# Patient Record
Sex: Female | Born: 1970 | Race: Black or African American | Hispanic: No | Marital: Married | Smoking: Never smoker
Health system: Southern US, Community
[De-identification: ages and names within clinical notes are randomized; demographics above are authoritative.]

## PROBLEM LIST (undated history)

## (undated) DIAGNOSIS — I10 Essential (primary) hypertension: Secondary | ICD-10-CM

## (undated) DIAGNOSIS — G629 Polyneuropathy, unspecified: Secondary | ICD-10-CM

## (undated) HISTORY — PX: CHOLECYSTECTOMY: SHX55

## (undated) HISTORY — PX: ABDOMINAL HYSTERECTOMY: SHX81

## (undated) HISTORY — DX: Polyneuropathy, unspecified: G62.9

---

## 2009-07-01 ENCOUNTER — Emergency Department: Payer: Self-pay | Admitting: Emergency Medicine

## 2010-03-25 HISTORY — PX: CHOLECYSTECTOMY: SHX55

## 2011-02-21 ENCOUNTER — Ambulatory Visit: Payer: Self-pay | Admitting: Family Medicine

## 2011-08-28 ENCOUNTER — Ambulatory Visit: Payer: Self-pay | Admitting: Obstetrics and Gynecology

## 2011-08-28 LAB — BASIC METABOLIC PANEL
Anion Gap: 8 (ref 7–16)
BUN: 13 mg/dL (ref 7–18)
Calcium, Total: 8.9 mg/dL (ref 8.5–10.1)
Chloride: 107 mmol/L (ref 98–107)
Co2: 29 mmol/L (ref 21–32)
Creatinine: 0.69 mg/dL (ref 0.60–1.30)
EGFR (African American): >60
EGFR (Non-African Amer.): >60
Glucose: 91 mg/dL (ref 65–99)
Osmolality: 287 (ref 275–301)
Potassium: 4.5 mmol/L (ref 3.5–5.1)

## 2011-08-28 LAB — CBC
MCH: 25.1 pg — ABNORMAL LOW (ref 26.0–34.0)
MCV: 79 fL — ABNORMAL LOW (ref 80–100)
RBC: 5.34 10*6/uL — ABNORMAL HIGH (ref 3.80–5.20)
RDW: 19.3 % — ABNORMAL HIGH (ref 11.5–14.5)
WBC: 6.1 10*3/uL (ref 3.6–11.0)

## 2011-09-02 ENCOUNTER — Inpatient Hospital Stay: Payer: Self-pay | Admitting: Obstetrics and Gynecology

## 2011-09-02 HISTORY — PX: TOTAL ABDOMINAL HYSTERECTOMY: SHX209

## 2011-09-10 LAB — PATHOLOGY REPORT

## 2011-10-30 ENCOUNTER — Ambulatory Visit: Payer: Self-pay | Admitting: Obstetrics and Gynecology

## 2014-04-12 ENCOUNTER — Ambulatory Visit: Payer: Self-pay | Admitting: Family Medicine

## 2014-07-17 NOTE — H&P (Signed)
PATIENT NAME:  Joyce Rose, Joyce Rose MR#:  [id number] DATE OF BIRTH:  1970-08-01  DATE OF ADMISSION:  09/02/2011  PREOPERATIVE DIAGNOSIS: Multifibroid uterus.   HISTORY: Joyce Rose is a 44 year old married African American female para 0-0-2-0, now status post six months of Depo-Lupron injections for treatment of large central pelvic mass, who presents for definitive surgery. Patient had a 36 week sized fibroid uterus prior to onset of Lupron therapy. The mass has decreased in size to approximately 29 weeks size. Patient is amenorrheic. She is having mild hot flashes.   PAST MEDICAL HISTORY:  1. Increased body mass index.  2. Dysmenorrhea.   PAST SURGICAL HISTORY:  1. Open cholecystectomy.  2. TAB.  PAST OB HISTORY: Para 0-0-2-0. SAB x1. TAB x1.   PAST GYN HISTORY: Menarche age 44. History of dysmenorrhea. No history of abnormal Pap smears. No history of STI or pelvic inflammatory disease.   FAMILY HISTORY: Negative for cancer of the colon, ovary, or breast. There is heart disease in father who had CABG x4. There was no diabetes mellitus in the family.   SOCIAL HISTORY: Patient does not smoke, does not drink, does not use drugs.   REVIEW OF SYSTEMS: Patient denies recent illness. She denies history of coagulopathy. She denies reactive airway disease.   PHYSICAL EXAMINATION:  VITAL SIGNS: Height 5 feet 2, weight 196.3, blood pressure 150/92, heart rate 66.   GENERAL: The patient is a pleasant, well-appearing African American female in no acute distress. She is alert and oriented.   OROPHARYNX: Clear.   NECK: Supple. There is no thyromegaly or adenopathy.   LUNGS: Clear.   HEART: Regular rate and rhythm without murmur, S3 or S4.   BACK: Without CVA tenderness.   ABDOMEN: Soft. There is a mildly tender central pelvic mass more prominent on the left than the right extending to the subxiphoid region. The fundal height is 29 cm. No organomegaly of the liver or spleen can be appreciated.    PELVIC: External genitalia normal. BUS normal. Vagina has good estrogen effect. The cervix is suspended high in the vagina. It is poorly mobile. Adnexa cannot be assessed due to the size of the pelvic mass. The mass is mobile.   RECTOVAGINAL: Deferred.   EXTREMITIES: Without clubbing, cyanosis, or edema.   SKIN: Without rash.   MUSCULOSKELETAL: Exam is normal.   NEUROLOGIC: Normal.   IMPRESSION: Multifibroid uterus, status post six months of Depo-Lupron therapy.   PLAN: Exploratory laparotomy through a midline incision with total abdominal hysterectomy. Date of surgery is 09/02/2011.    CONSENT NOTE: Joyce Rose is to undergo total abdominal hysterectomy through a midline incision. She is understanding of the planned procedures and is aware of and is accepting of all surgical risks which include but are not limited to bleeding, infection, pelvic organ injury with need for repair, blood clot disorders, anesthesia risks, and death. Patient understands that we will not remove the ovaries unless pathology is identified. She is understanding of this and is accepting of it and she is ready and willing to proceed with surgery as scheduled.   ____________________________ Prentice DockerMartin A. Salil Raineri, MD mad:cms D: 09/01/2011 17:37:28 ET T: 09/02/2011 07:25:00 ET JOB#: [id number]  cc: Daphine DeutscherMartin A. Karoline Fleer, MD, <Dictator>  Prentice DockerMARTIN A Malike Foglio MD ELECTRONICALLY SIGNED 09/03/2011 13:16

## 2014-07-17 NOTE — Op Note (Signed)
PATIENT NAME:  Joyce Rose, Joyce Rose MR#:  [id number] DATE OF BIRTH:  01-24-1971  DATE OF PROCEDURE:  09/02/2011  PREOPERATIVE DIAGNOSIS: 30 week multifibroid uterus.   POSTOPERATIVE DIAGNOSES: 1. 30 week multifibroid uterus.  2. Pelvic adhesions.   OPERATIVE PROCEDURES: Exploratory laparotomy with total abdominal hysterectomy and adhesiolysis.   SURGEON: Prentice Docker. Joyce Collister, MD  FIRST ASSISTANT: None.   ANESTHESIA: General endotracheal.   INDICATIONS: Joyce Rose is a 44 year old African American female para 0-0-2-0, status post six months of Depo-Lupron therapy with reduction in size of uterine fibroids from 36 weeks to 30 weeks, presents for laparotomy for management of central pelvic mass.   FINDINGS AT SURGERY: Findings at surgery revealed a multifibroid nodular uterus measuring approximately 30 weeks size. The right adnexa was adhesed to the pelvic sidewall and subsequent adhesiolysis had to be performed in order to restore normal anatomy. The left ovary and tube were normal. Multiple adhesions between the omentum and bowel were lysed during the surgical procedure.   DESCRIPTION OF PROCEDURE: Patient was brought to the Operating Room where she was placed in supine position. General endotracheal anesthesia was induced without difficulty. A ChloraPrep abdominal and perineal prep and drape was performed in the standard fashion. A Foley catheter was placed and was draining clear yellow urine from the bladder. A midline incision was made 1 cm below the umbilicus to the suprapubic region 1 fingerbreadth above the symphysis pubis. The fascia was incised in the midline and this was dissected off of the rectus muscles to identify the midline raphe. The peritoneum was then entered. Exploration of the abdomen reveals the central pelvic mass which was nodular and somewhat mobile. There were adhesions identified posteriorly. Eventually the 30 week size mass was elevated and delivered through the  abdominal incision. Richardson retractors were used to facilitate exposure and the hysterectomy was then performed in a modified manner. The left utero-ovarian ligament was doubly clamped, cut, and stick tied using 0 Vicryl. Adjacent adhesions between the omentum and bowel were lysed using Metzenbaum scissors. The round ligament was then doubly clamped and cut with subsequent suture ligation being performed with 0 Vicryl. Posteriorly as the uterus was elevated more out of the pelvis further adhesiolysis had to be performed. Several of the thickened adhesions were taken down with hemostats followed by free ties. Eventually the pelvis was completely elevated out of the pelvis following the adhesiolysis. On the right side the utero-ovarian ligament was doubly clamped, cut, and stick tied using 0 Vicryl. Again similarly the round ligament and the fallopian tube were crossclamped using Heaney clamps followed by cutting and stick tying. This was done sequentially on both sides to the point of where the uterine arteries were triply clamped and cut. These again were stick tied using 0 Vicryl. The anterior leaf of the peritoneum was opened and the bladder flap was created through sharp dissection. Further clamping, cutting, and stick tying using straight clamps and 0 Vicryl suture were used. Once we got to the level of the uterosacral ligaments the mass was excised with a scalpel. The remaining cervical stump was grasped with a double-tooth tenaculum. Further adhesiolysis was performed on the right adnexa to mobilize the adhesed right ovary. The ureters were noted to be in normal anatomic position bilaterally and had peristalsis. Next, the cervical stump was then sequentially isolated, clamped, cut, and stick tied with 0 Vicryl suture. This was done as the bladder flap was reflected further off of the cervix. Once we got to the angles of the  cervix curved Heaney clamps were used to crossclamp the structure followed by  excision with Jorgenson scissors. The angles of the vagina were ligated using Richardson stitch of 0 Vicryl. The intervening vaginal mucosa was reapproximated using simple figure-of-eight sutures of 0 Vicryl. Good hemostasis was obtained. Once this was accomplished the pelvis was copiously irrigated. The irrigant fluid was aspirated. Good hemostasis was noted. Closure of the wound was then accomplished with removal of Balfour retractor. The laps were collected and sponge counts were verified as correct. The incision was closed with 0 Maxon in simple running manner. The subcuticular and subcutaneous tissues were reapproximated using 2-0 Vicryl. The skin was closed with staples. A pressure dressing was applied. Patient was then awakened, mobilized, and taken to recovery room in satisfactory condition. Estimated blood loss was 400 mL. IV fluids were 1700 mL. Urine output was 450 mL.   ____________________________ Prentice DockerMartin A. Enos Muhl, MD mad:cms D: 09/03/2011 13:06:00 ET T: 09/03/2011 13:28:36 ET  JOB#: [id number] cc: Daphine DeutscherMartin A. Rajesh Wyss, MD, <Dictator> Prentice DockerMARTIN A Joyce Kimoto MD ELECTRONICALLY SIGNED 09/10/2011 12:49

## 2015-03-30 ENCOUNTER — Other Ambulatory Visit: Payer: Self-pay | Admitting: Family Medicine

## 2015-03-30 DIAGNOSIS — Z1231 Encounter for screening mammogram for malignant neoplasm of breast: Secondary | ICD-10-CM

## 2015-04-14 ENCOUNTER — Ambulatory Visit
Admission: RE | Admit: 2015-04-14 | Discharge: 2015-04-14 | Disposition: A | Discharge status: Home / Self Care | Payer: BLUE CROSS/BLUE SHIELD | Source: Ambulatory Visit | Attending: Family Medicine | Admitting: Family Medicine

## 2015-04-14 DIAGNOSIS — Z1231 Encounter for screening mammogram for malignant neoplasm of breast: Secondary | ICD-10-CM | POA: Diagnosis present

## 2016-05-02 ENCOUNTER — Other Ambulatory Visit: Payer: Self-pay | Admitting: Family Medicine

## 2016-05-02 DIAGNOSIS — Z1231 Encounter for screening mammogram for malignant neoplasm of breast: Secondary | ICD-10-CM

## 2016-05-30 ENCOUNTER — Ambulatory Visit: Payer: BLUE CROSS/BLUE SHIELD

## 2016-06-24 ENCOUNTER — Ambulatory Visit
Admission: RE | Admit: 2016-06-24 | Discharge: 2016-06-24 | Disposition: A | Discharge status: Home / Self Care | Payer: BLUE CROSS/BLUE SHIELD | Source: Ambulatory Visit | Attending: Family Medicine | Admitting: Family Medicine

## 2016-06-24 DIAGNOSIS — Z1231 Encounter for screening mammogram for malignant neoplasm of breast: Secondary | ICD-10-CM | POA: Insufficient documentation

## 2018-12-25 IMAGING — MG MM DIGITAL SCREENING BILAT W/ CAD
5 series · 5 of 5 positions shown · non-contrast
Comparison: Previous exam(s).

CLINICAL DATA: Screening.

EXAM:
DIGITAL SCREENING BILATERAL MAMMOGRAM WITH CAD

[R XCCL]
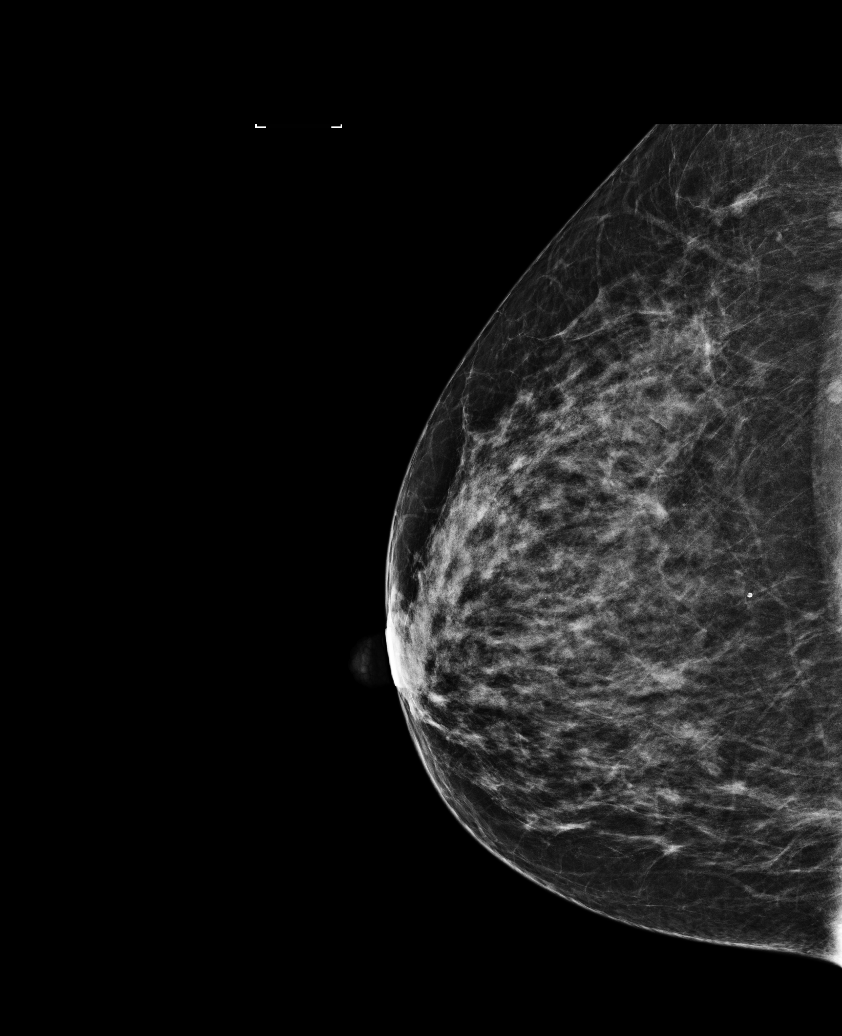

[L MLO]
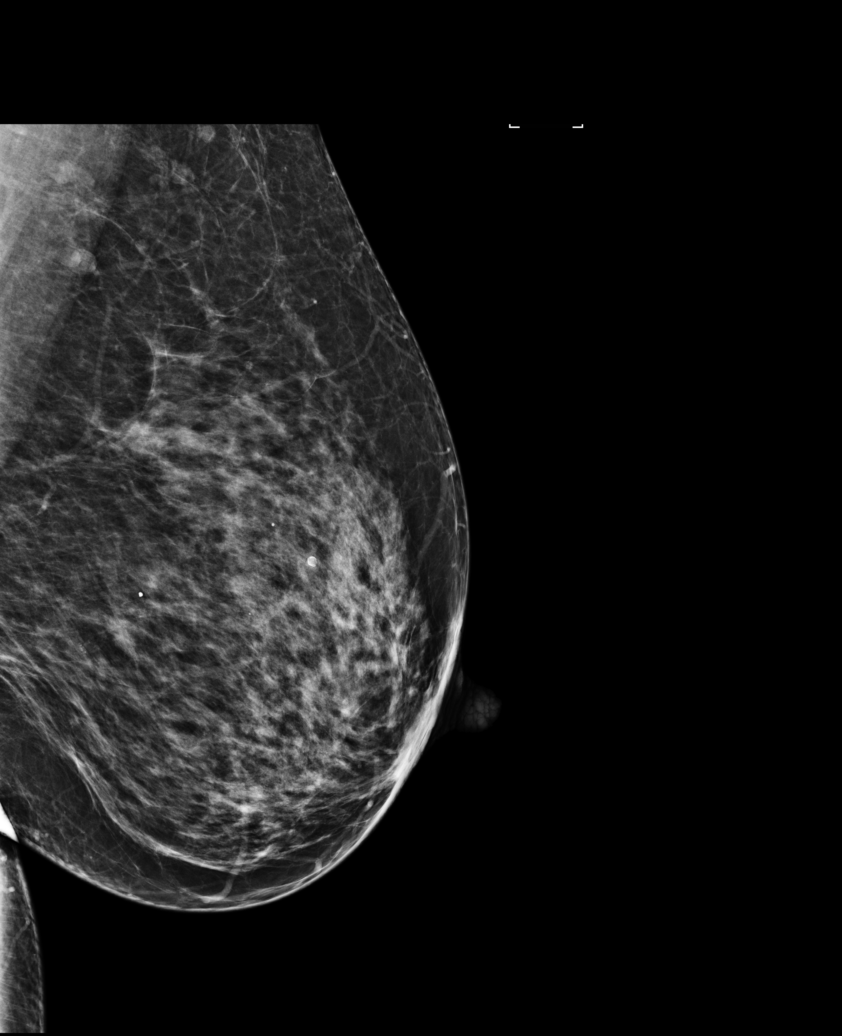

[R CC]
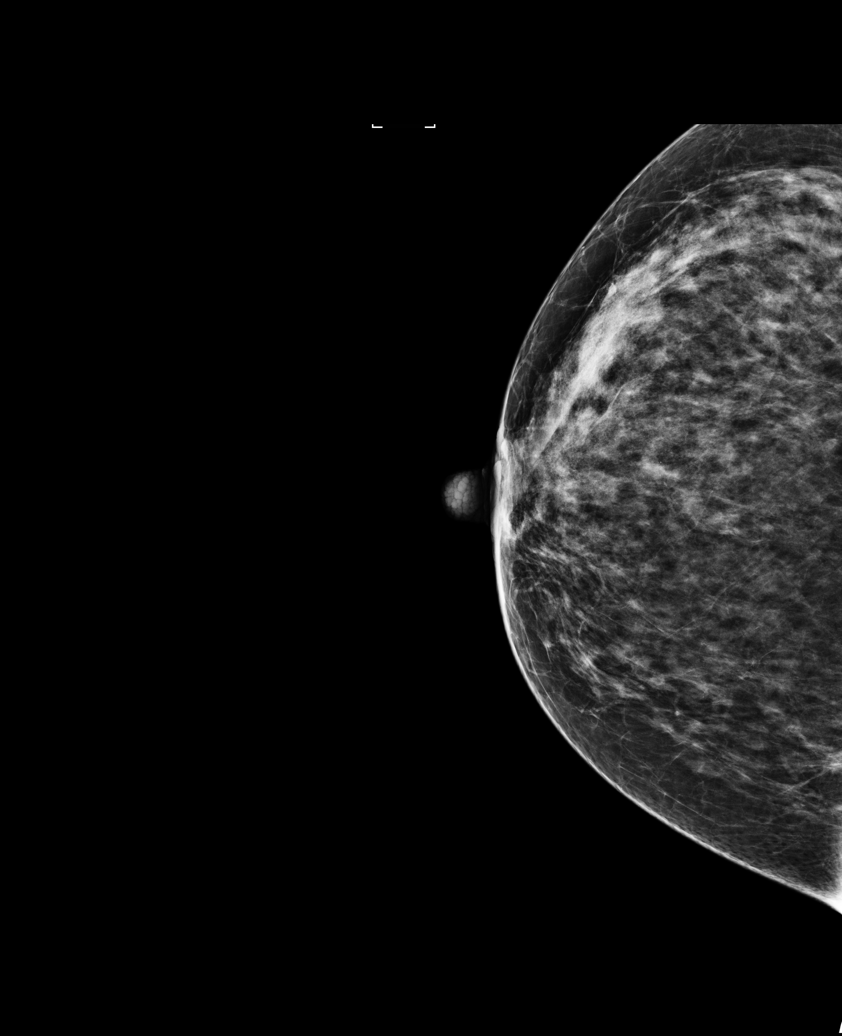

[R MLO]
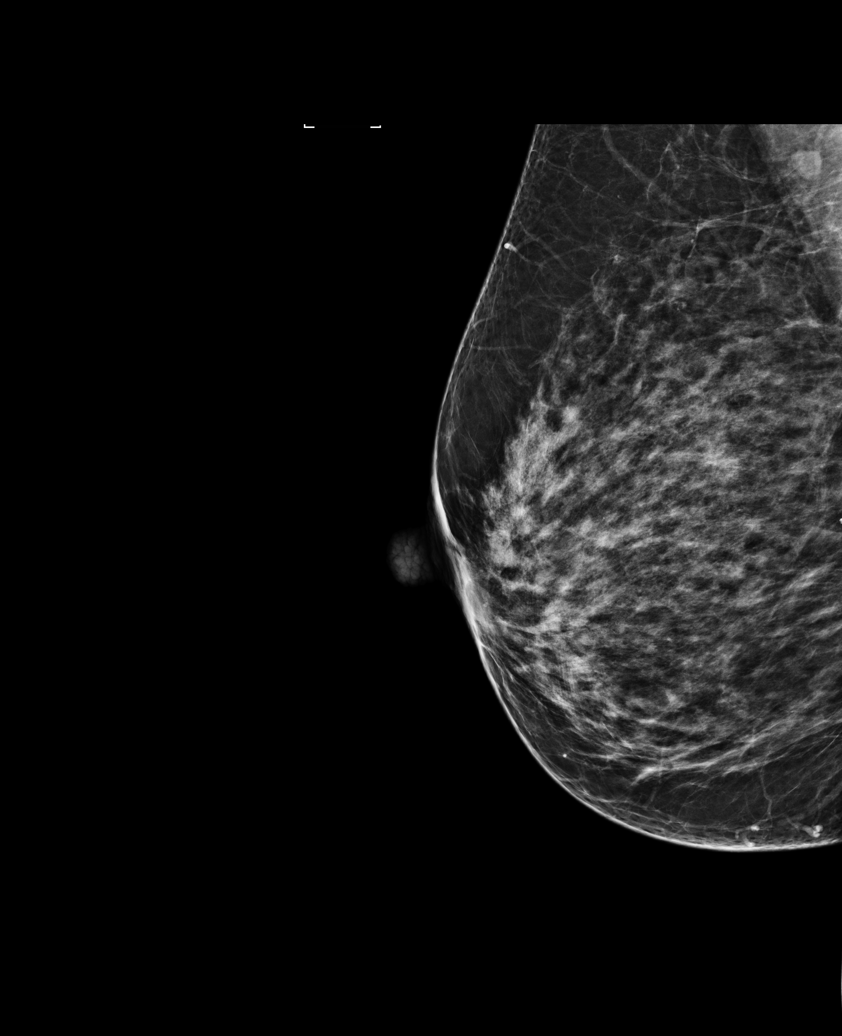

[L CC]
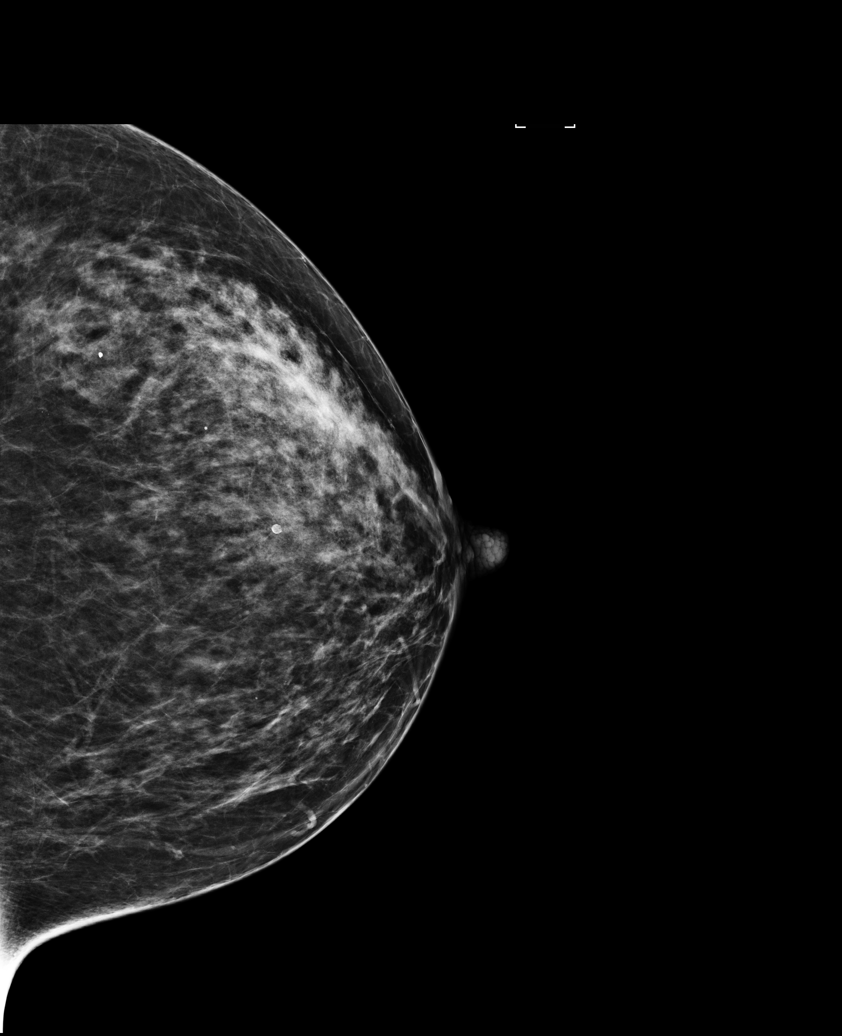

[5 of 5 positions shown; findings below may reference images not displayed]

ACR Breast Density Category c: The breast tissue is heterogeneously
dense, which may obscure small masses.
FINDINGS: There are no findings suspicious for malignancy. Images were
processed with CAD.
IMPRESSION: No mammographic evidence of malignancy. A result letter of this
screening mammogram will be mailed directly to the patient.

RECOMMENDATION:
Screening mammogram in one year. (Code:YJ-2-FEZ)

BI-RADS CATEGORY  1: Negative.

## 2021-04-23 ENCOUNTER — Other Ambulatory Visit: Payer: Self-pay | Admitting: Family Medicine

## 2021-04-23 DIAGNOSIS — Z1231 Encounter for screening mammogram for malignant neoplasm of breast: Secondary | ICD-10-CM

## 2021-05-01 DIAGNOSIS — G5603 Carpal tunnel syndrome, bilateral upper limbs: Secondary | ICD-10-CM | POA: Diagnosis not present

## 2021-05-07 DIAGNOSIS — D751 Secondary polycythemia: Secondary | ICD-10-CM | POA: Diagnosis not present

## 2021-05-07 DIAGNOSIS — R7303 Prediabetes: Secondary | ICD-10-CM | POA: Diagnosis not present

## 2021-05-08 DIAGNOSIS — G5601 Carpal tunnel syndrome, right upper limb: Secondary | ICD-10-CM | POA: Diagnosis not present

## 2021-05-08 DIAGNOSIS — G5603 Carpal tunnel syndrome, bilateral upper limbs: Secondary | ICD-10-CM | POA: Diagnosis not present

## 2021-05-14 DIAGNOSIS — D751 Secondary polycythemia: Secondary | ICD-10-CM | POA: Diagnosis not present

## 2021-05-14 DIAGNOSIS — R7303 Prediabetes: Secondary | ICD-10-CM | POA: Diagnosis not present

## 2021-05-14 DIAGNOSIS — E669 Obesity, unspecified: Secondary | ICD-10-CM | POA: Diagnosis not present

## 2021-05-31 ENCOUNTER — Inpatient Hospital Stay: Admission: RE | Admit: 2021-05-31 | Payer: BLUE CROSS/BLUE SHIELD | Source: Ambulatory Visit

## 2021-06-26 DIAGNOSIS — G5621 Lesion of ulnar nerve, right upper limb: Secondary | ICD-10-CM | POA: Diagnosis not present

## 2021-06-26 DIAGNOSIS — G5601 Carpal tunnel syndrome, right upper limb: Secondary | ICD-10-CM | POA: Diagnosis not present

## 2021-07-27 DIAGNOSIS — M25531 Pain in right wrist: Secondary | ICD-10-CM | POA: Diagnosis not present

## 2021-07-27 DIAGNOSIS — G5601 Carpal tunnel syndrome, right upper limb: Secondary | ICD-10-CM | POA: Diagnosis not present

## 2021-07-27 DIAGNOSIS — G5621 Lesion of ulnar nerve, right upper limb: Secondary | ICD-10-CM | POA: Diagnosis not present

## 2021-07-27 DIAGNOSIS — G8918 Other acute postprocedural pain: Secondary | ICD-10-CM | POA: Diagnosis not present

## 2021-11-01 DIAGNOSIS — R200 Anesthesia of skin: Secondary | ICD-10-CM | POA: Diagnosis not present

## 2021-11-01 DIAGNOSIS — G5603 Carpal tunnel syndrome, bilateral upper limbs: Secondary | ICD-10-CM | POA: Diagnosis not present

## 2021-11-01 DIAGNOSIS — Z7689 Persons encountering health services in other specified circumstances: Secondary | ICD-10-CM | POA: Diagnosis not present

## 2021-11-01 DIAGNOSIS — R202 Paresthesia of skin: Secondary | ICD-10-CM | POA: Diagnosis not present

## 2021-11-07 DIAGNOSIS — Z13220 Encounter for screening for lipoid disorders: Secondary | ICD-10-CM | POA: Diagnosis not present

## 2021-11-07 DIAGNOSIS — Z0000 Encounter for general adult medical examination without abnormal findings: Secondary | ICD-10-CM | POA: Diagnosis not present

## 2021-11-07 DIAGNOSIS — D751 Secondary polycythemia: Secondary | ICD-10-CM | POA: Diagnosis not present

## 2021-11-07 DIAGNOSIS — R7303 Prediabetes: Secondary | ICD-10-CM | POA: Diagnosis not present

## 2021-12-11 DIAGNOSIS — Z0000 Encounter for general adult medical examination without abnormal findings: Secondary | ICD-10-CM | POA: Diagnosis not present

## 2021-12-11 DIAGNOSIS — R7303 Prediabetes: Secondary | ICD-10-CM | POA: Diagnosis not present

## 2021-12-11 DIAGNOSIS — E669 Obesity, unspecified: Secondary | ICD-10-CM | POA: Diagnosis not present

## 2021-12-11 DIAGNOSIS — R030 Elevated blood-pressure reading, without diagnosis of hypertension: Secondary | ICD-10-CM | POA: Diagnosis not present

## 2021-12-25 DIAGNOSIS — Z0000 Encounter for general adult medical examination without abnormal findings: Secondary | ICD-10-CM | POA: Diagnosis not present

## 2021-12-25 LAB — COLOGUARD: Cologuard: NEGATIVE

## 2021-12-27 DIAGNOSIS — I10 Essential (primary) hypertension: Secondary | ICD-10-CM | POA: Diagnosis not present

## 2021-12-27 DIAGNOSIS — K219 Gastro-esophageal reflux disease without esophagitis: Secondary | ICD-10-CM | POA: Diagnosis not present

## 2021-12-31 LAB — COLOGUARD: COLOGUARD: NEGATIVE

## 2022-01-11 ENCOUNTER — Other Ambulatory Visit: Payer: Self-pay

## 2022-01-11 ENCOUNTER — Emergency Department
Admission: EM | Admit: 2022-01-11 | Discharge: 2022-01-11 | Disposition: A | Discharge status: Home / Self Care | Payer: BC Managed Care – PPO | Attending: Emergency Medicine | Admitting: Emergency Medicine

## 2022-01-11 ENCOUNTER — Encounter: Payer: Self-pay | Admitting: Emergency Medicine

## 2022-01-11 ENCOUNTER — Emergency Department: Payer: BC Managed Care – PPO

## 2022-01-11 DIAGNOSIS — I10 Essential (primary) hypertension: Secondary | ICD-10-CM | POA: Diagnosis not present

## 2022-01-11 DIAGNOSIS — R0602 Shortness of breath: Secondary | ICD-10-CM | POA: Diagnosis not present

## 2022-01-11 DIAGNOSIS — Z79899 Other long term (current) drug therapy: Secondary | ICD-10-CM | POA: Diagnosis not present

## 2022-01-11 DIAGNOSIS — E876 Hypokalemia: Secondary | ICD-10-CM | POA: Insufficient documentation

## 2022-01-11 DIAGNOSIS — R051 Acute cough: Secondary | ICD-10-CM | POA: Diagnosis not present

## 2022-01-11 DIAGNOSIS — R059 Cough, unspecified: Secondary | ICD-10-CM | POA: Diagnosis not present

## 2022-01-11 DIAGNOSIS — R062 Wheezing: Secondary | ICD-10-CM | POA: Diagnosis not present

## 2022-01-11 DIAGNOSIS — R0981 Nasal congestion: Secondary | ICD-10-CM | POA: Diagnosis not present

## 2022-01-11 DIAGNOSIS — R079 Chest pain, unspecified: Secondary | ICD-10-CM | POA: Diagnosis not present

## 2022-01-11 DIAGNOSIS — Z20822 Contact with and (suspected) exposure to covid-19: Secondary | ICD-10-CM | POA: Diagnosis not present

## 2022-01-11 DIAGNOSIS — R058 Other specified cough: Secondary | ICD-10-CM | POA: Diagnosis not present

## 2022-01-11 HISTORY — DX: Essential (primary) hypertension: I10

## 2022-01-11 LAB — COMPREHENSIVE METABOLIC PANEL
ALT: 44 U/L (ref 0–44)
AST: 34 U/L (ref 15–41)
Albumin: 4.2 g/dL (ref 3.5–5.0)
Alkaline Phosphatase: 96 U/L (ref 38–126)
Anion gap: 12 (ref 5–15)
BUN: 18 mg/dL (ref 6–20)
CO2: 29 mmol/L (ref 22–32)
Calcium: 9.7 mg/dL (ref 8.9–10.3)
Chloride: 97 mmol/L — ABNORMAL LOW (ref 98–111)
Creatinine, Ser: 1.24 mg/dL — ABNORMAL HIGH (ref 0.44–1.00)
GFR, Estimated: 52 mL/min — ABNORMAL LOW (ref >60)
Glucose, Bld: 107 mg/dL — ABNORMAL HIGH (ref 70–99)
Potassium: 2.9 mmol/L — ABNORMAL LOW (ref 3.5–5.1)
Sodium: 138 mmol/L (ref 135–145)
Total Bilirubin: 0.5 mg/dL (ref 0.3–1.2)
Total Protein: 7.8 g/dL (ref 6.5–8.1)

## 2022-01-11 LAB — RESP PANEL BY RT-PCR (FLU A&B, COVID) ARPGX2
Influenza A by PCR: NEGATIVE
Influenza B by PCR: NEGATIVE
SARS Coronavirus 2 by RT PCR: NEGATIVE

## 2022-01-11 LAB — TROPONIN I (HIGH SENSITIVITY): Troponin I (High Sensitivity): 5 ng/L (ref <18)

## 2022-01-11 MED ORDER — POTASSIUM CHLORIDE CRYS ER 20 MEQ PO TBCR: 20.0000 meq | EXTENDED_RELEASE_TABLET | Freq: Two times a day (BID) | ORAL | 0 refills | Status: DC

## 2022-01-11 MED ORDER — PREDNISONE 50 MG PO TABS: 50.0000 mg | ORAL_TABLET | Freq: Every day | ORAL | 0 refills | Status: AC

## 2022-01-11 MED ORDER — DOXYCYCLINE MONOHYDRATE 100 MG PO TABS: 100.0000 mg | ORAL_TABLET | Freq: Two times a day (BID) | ORAL | 0 refills | Status: AC

## 2022-01-11 NOTE — ED Triage Notes (Signed)
Pt to ED from [facility] for cough x 8 weeks. Pt states that she has had a dry cough. Pt also c/o tightness in her chest. Pt states that she is having shortness of breath as well. Pt states that she has pain when breathing. Pt states that she was given medication for acid reflux but is has not helped. Pt was seen again today and had an EKG pt was sent over for evaluation of abnormal EKG.

## 2022-01-11 NOTE — ED Provider Notes (Signed)
[facility] Provider Note    Event Date/Time   First MD Initiated Contact with Patient 01/11/22 1644     (approximate)   History   Cough   HPI  Joyce Rose is a 51 y.o. female with past medical history significant for hypertension who presents to the emergency department with cough.  Patient Joyce Rose is an 8-week history of cough that has been progressively worsening.  States that is significantly worsened over the past 2 weeks.  Denies any tobacco use or vaping.  No occupational exposure.  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  Denies any trip or travel.  Denies any chest pain except with coughing.  States that Joyce Rose was started on an acid reducing medication 2 weeks ago with her primary care provider and that her symptoms have worsened since that time.  Joyce Rose had a recheck visit today and was told to come to the emergency department for evaluation.  States that her symptoms are worse at nighttime with coughing.  No other family members with similar symptoms.  No history of DVT or PE.  Takes thiazide medication for her hypertension.      Physical Exam   Triage Vital Signs: ED Triage Vitals  Enc Vitals Group     BP 01/11/22 1545 (!) 141/97     Pulse Rate 01/11/22 1545 80     Resp 01/11/22 1545 16     Temp 01/11/22 1547 98.2 F (36.8 C)     Temp Source 01/11/22 1547 Oral     SpO2 01/11/22 1545 94 %     Weight --      Height --      Head Circumference --      Peak Flow --      Pain Score 01/11/22 1545 6     Pain Loc --      Pain Edu? --      Excl. in [place]? --     Most recent vital signs: Vitals:   01/11/22 1545 01/11/22 1547  BP: (!) 141/97   Pulse: 80   Resp: 16   Temp:  98.2 F (36.8 C)  SpO2: 94%     Physical Exam Constitutional:      Appearance: Joyce Rose is well-developed.  HENT:     Head: Atraumatic.  Eyes:     Conjunctiva/sclera: Conjunctivae normal.  Cardiovascular:     Rate and Rhythm: Regular rhythm.  Pulmonary:     Effort: No  respiratory distress.     Breath sounds: Wheezing present.  Abdominal:     General: There is no distension.  Musculoskeletal:        General: Normal range of motion.     Cervical back: Normal range of motion.  Skin:    General: Skin is warm.  Neurological:     Mental Status: Joyce Rose is alert. Mental status is at baseline.          IMPRESSION / MDM / ASSESSMENT AND PLAN / ED COURSE  I reviewed the triage vital signs and the nursing notes.  Differential diagnosis including ACS, pneumonia, viral illness, acid reflux, medication side effect, CHF, pulmonary hypertension, sarcoidosis.  Patient's COVID and influenza testing were negative.   EKG  My interpretation of the EKG -normal sinus rhythm.  Normal intervals.  No chamber enlargement.  No significant ST elevation or depression.  No signs of acute ischemia or dysrhythmia.  Q waves present in the inferior leads.  Nonspecific T wave changes.  No tachycardic or  bradycardic dysrhythmias while on cardiac telemetry.  RADIOLOGY [My interpretation of imaging: Chest x-ray with no signs of pneumonia.  No signs of pulmonary edema or pleural effusions]    ED Results / Procedures / Treatments   Labs (all labs ordered are listed, but only abnormal results are displayed) Labs interpreted as -   Hypokalemia with a potassium of 2.9.  Creatinine appears to be at her baseline.  No significant anemia.  No significant elevation of troponin, initial troponin is negative.  COVID and influenza testing are negative.  Labs Reviewed  COMPREHENSIVE METABOLIC PANEL - Abnormal; Notable for the following components:      Result Value   Potassium 2.9 (*)    Chloride 97 (*)    Glucose, Bld 107 (*)    Creatinine, Ser 1.24 (*)    GFR, Estimated 52 (*)    All other components within normal limits  RESP PANEL BY RT-PCR (FLU A&B, COVID) ARPGX2  CBC  POC URINE PREG, ED  TROPONIN I (HIGH SENSITIVITY)  TROPONIN I (HIGH SENSITIVITY)    Clinical picture is  not consistent with pulmonary embolism.  Clinical picture is not consistent with ACS, troponin is negative.  Do not feel that serial troponins are necessary at this time.  Low risk heart score.  Patient is not on an ACE inhibitor so like clean out the medication induced cause of her cough.  Low risk Wells criteria, doubt pulmonary embolism.  Given information for potassium replacement.  We will start the patient on steroids and an antibiotic.  Discussed close follow-up with primary care physician as an outpatient.  We will treat the patient for community-acquired pneumonia.  No other occupational exposures.  No significant cardiomegaly on chest x-ray have a low suspicion for orthopnea of her symptoms.  Given return precautions for any worsening symptoms.  No questions or concerns at time of discharge.  Discussed follow-up with her primary care physician for potassium recheck.  PROCEDURES:  Critical Care performed: No  Procedures  Patient's presentation is most consistent with acute presentation with potential threat to life or bodily function.   MEDICATIONS ORDERED IN ED: Medications - No data to display  FINAL CLINICAL IMPRESSION(S) / ED DIAGNOSES   Final diagnoses:  Acute cough  Hypokalemia     Rx / DC Orders   ED Discharge Orders          Ordered    doxycycline (ADOXA) 100 MG tablet  2 times daily        01/11/22 1734    predniSONE (DELTASONE) 50 MG tablet  Daily with breakfast        01/11/22 1734    potassium chloride SA (KLOR-CON M) 20 MEQ tablet  2 times daily        01/11/22 1734             Note:  This document was prepared using Dragon voice recognition software and may include unintentional dictation errors.   Corena Herter, MD 01/11/22 Flossie Buffy

## 2022-01-11 NOTE — Discharge Instructions (Addendum)
You are seen in the emergency apartment for a cough.  You had a chest x-ray that did not show any signs of pneumonia.  Your heart enzyme (troponin) was normal.  Do not believe that you are having symptoms of a heart attack.  Your COVID and influenza testing were negative.  Your potassium was mildly low which is likely in the setting of your blood pressure medication.  You were given a prescription for steroid, antibiotic and potassium.  Take these as prescribed.  Follow-up with your primary care physician at the beginning of next week and return to the emergency department for any worsening symptoms of shortness of breath.  Your potassium was mildly low when checked today.  Make sure to follow up with a primary doctor to follow up your labs.  Make sure to eat food high in potassium and magnesium - examples - potatoes, spinach, bananas, beans, avocadoes, oranges, nuts.  Doxycycline - This medication can cause acid reflux.  It is important that you take it with food and drink plenty of water.  Do not lie down for 1 hour after taking this medication.  It also causes sun sensitivity so stay out of the sun or wear SPF while on this medication.

## 2022-01-11 NOTE — ED Provider Triage Note (Signed)
Emergency Medicine Provider Triage Evaluation Note  KENADI MILTNER, a 51 y.o. female history of recently diagnosed hypertension and GERD was evaluated in triage.  Pt complains of cough x 8 weeks.  She reports a dry cough intermittent mitten tightness in her central chest.  She is also reporting some mild shortness of breath. She presents to the ED from [facility] for concern for a possible EKG abnormality.   Review of Systems  Positive: Cough, SOB Negative: FCS  Physical Exam  BP (!) 141/97 (BP Location: Left Arm)   Pulse 80   Temp 98.2 F (36.8 C) (Oral)   Resp 16   SpO2 94%  Gen:   Awake, no distress  NAD Resp:  Normal effort CTA MSK:   Moves extremities without difficulty  CVS:  RRR  Medical Decision Making  Medically screening exam initiated at 3:47 PM.  Appropriate orders placed.  EMMALEE SOLIVAN was informed that the remainder of the evaluation will be completed by another provider, this initial triage assessment does not replace that evaluation, and the importance of remaining in the ED until their evaluation is complete.  Patient to the ED for evaluation of 8 weeks of persistent cough, with intermittent shortness of breath and some mild chest tightness   Dajanique Robley, Dannielle Karvonen, PA-C 01/11/22 1549

## 2022-01-17 ENCOUNTER — Other Ambulatory Visit: Payer: Self-pay

## 2022-01-17 ENCOUNTER — Emergency Department
Admission: EM | Admit: 2022-01-17 | Discharge: 2022-01-17 | Disposition: A | Discharge status: Home / Self Care | Payer: BC Managed Care – PPO | Attending: Emergency Medicine | Admitting: Emergency Medicine

## 2022-01-17 ENCOUNTER — Encounter: Payer: Self-pay | Admitting: Emergency Medicine

## 2022-01-17 DIAGNOSIS — I10 Essential (primary) hypertension: Secondary | ICD-10-CM | POA: Diagnosis not present

## 2022-01-17 DIAGNOSIS — R55 Syncope and collapse: Secondary | ICD-10-CM | POA: Insufficient documentation

## 2022-01-17 DIAGNOSIS — R718 Other abnormality of red blood cells: Secondary | ICD-10-CM | POA: Insufficient documentation

## 2022-01-17 DIAGNOSIS — E860 Dehydration: Secondary | ICD-10-CM | POA: Diagnosis not present

## 2022-01-17 DIAGNOSIS — R42 Dizziness and giddiness: Secondary | ICD-10-CM

## 2022-01-17 LAB — CBC
HCT: 48.7 % — ABNORMAL HIGH (ref 36.0–46.0)
Hemoglobin: 15.9 g/dL — ABNORMAL HIGH (ref 12.0–15.0)
MCH: 30.1 pg (ref 26.0–34.0)
MCHC: 32.6 g/dL (ref 30.0–36.0)
MCV: 92.1 fL (ref 80.0–100.0)
Platelets: 212 10*3/uL (ref 150–400)
RBC: 5.29 MIL/uL — ABNORMAL HIGH (ref 3.87–5.11)
RDW: 13.1 % (ref 11.5–15.5)
WBC: 16.4 10*3/uL — ABNORMAL HIGH (ref 4.0–10.5)
nRBC: 0 % (ref 0.0–0.2)

## 2022-01-17 LAB — URINALYSIS, ROUTINE W REFLEX MICROSCOPIC
Bilirubin Urine: NEGATIVE
Glucose, UA: NEGATIVE mg/dL
Hgb urine dipstick: NEGATIVE
Ketones, ur: NEGATIVE mg/dL
Leukocytes,Ua: NEGATIVE
Nitrite: NEGATIVE
Protein, ur: NEGATIVE mg/dL
Specific Gravity, Urine: 1.015 (ref 1.005–1.030)
pH: 5 (ref 5.0–8.0)

## 2022-01-17 LAB — TROPONIN I (HIGH SENSITIVITY): Troponin I (High Sensitivity): 6 ng/L (ref <18)

## 2022-01-17 NOTE — Discharge Instructions (Signed)
As you did not wish to stay for the repeat basic metabolic panel, but should be rechecked by your outpatient provider this week.  Please return for any new, worsening, or change in symptoms or other concerns.  It was a pleasure caring for you today.

## 2022-01-17 NOTE — ED Notes (Signed)
This RN and Lattie Haw, RN attempted for blood work with no success. Lab called for blood work.

## 2022-01-17 NOTE — ED Triage Notes (Signed)
Patient to ED via POV after syncopal episode at work. Patient states she thinks she accidentally took her BP medicine that she takes at night instead of her antibiotic. Patient states she feels weak.

## 2022-01-17 NOTE — ED Provider Notes (Signed)
[facility] Provider Note    Event Date/Time   First MD Initiated Contact with Patient 01/17/22 1537     (approximate)   History   Loss of Consciousness   HPI  Joyce Rose is a 51 y.o. female with a past medical history of hypertension who presents today for evaluation of presyncope.  Patient reports that at approximately 1:30 PM today she accidentally took her blood pressure medication instead of her antibiotic.  She reports that approximately 15 to 30 minutes later she developed lightheadedness and tunnel vision.  She reports that she went to the bathroom and hung onto the sink but never fell to the ground.  Her coworkers called her husband who brought her to the emergency department.  Patient denies any chest pain, shortness of breath, abdominal pain, nausea, vomiting, diarrhea.  She reports that she feels improved now.  She also notes that she normally eats a large breakfast before work, but took several days off because she had a bad cough, and went back to work today and forgot to eat breakfast this morning.  She reports that her cough has improved significantly since taking the antibiotic and she has not had any constitutional symptoms.  There are no problems to display for this patient.         Physical Exam   Triage Vital Signs: ED Triage Vitals  Enc Vitals Group     BP 01/17/22 1500 (!) 140/81     Pulse Rate 01/17/22 1500 73     Resp 01/17/22 1500 18     Temp 01/17/22 1500 97.6 F (36.4 C)     Temp Source 01/17/22 1500 Oral     SpO2 01/17/22 1500 97 %     Weight 01/17/22 1501 200 lb (90.7 kg)     Height 01/17/22 1501 5\' 1"  (1.549 m)     Head Circumference --      Peak Flow --      Pain Score 01/17/22 1500 0     Pain Loc --      Pain Edu? --      Excl. in GC? --     Most recent vital signs: Vitals:   01/17/22 1500  BP: (!) 140/81  Pulse: 73  Resp: 18  Temp: 97.6 F (36.4 C)  SpO2: 97%    Physical Exam Vitals and nursing  note reviewed.  Constitutional:      General: Awake and alert. No acute distress.    Appearance: Normal appearance. The patient is obese  HENT:     Head: Normocephalic and atraumatic.     Mouth: Mucous membranes are moist.  Eyes:     General: PERRL. Normal EOMs        Right eye: No discharge.        Left eye: No discharge.     Conjunctiva/sclera: Conjunctivae normal.  Cardiovascular:     Rate and Rhythm: Normal rate and regular rhythm.     Pulses: Normal pulses.  Equal in all 4 extremities    Heart sounds: Normal heart sounds Pulmonary:     Effort: Pulmonary effort is normal. No respiratory distress.     Breath sounds: Normal breath sounds.  Abdominal:     Abdomen is soft. There is no abdominal tenderness. No rebound or guarding. No distention. Musculoskeletal:        General: No swelling. Normal range of motion.     Cervical back: Normal range of motion and neck supple.  Skin:  General: Skin is warm and dry.     Capillary Refill: Capillary refill takes less than 2 seconds.     Findings: No rash.  Neurological:     Mental Status: The patient is awake and alert.      ED Results / Procedures / Treatments   Labs (all labs ordered are listed, but only abnormal results are displayed) Labs Reviewed  CBC - Abnormal; Notable for the following components:      Result Value   WBC 16.4 (*)    RBC 5.29 (*)    Hemoglobin 15.9 (*)    HCT 48.7 (*)    All other components within normal limits  URINALYSIS, ROUTINE W REFLEX MICROSCOPIC - Abnormal; Notable for the following components:   Color, Urine YELLOW (*)    APPearance CLEAR (*)    All other components within normal limits  BASIC METABOLIC PANEL  CBG MONITORING, ED  POC URINE PREG, ED  TROPONIN I (HIGH SENSITIVITY)  TROPONIN I (HIGH SENSITIVITY)     EKG     RADIOLOGY     PROCEDURES:  Critical Care performed:   Procedures   MEDICATIONS ORDERED IN ED: Medications - No data to display   IMPRESSION / MDM  / [place] / ED COURSE  I reviewed the triage vital signs and the nursing notes.   Differential diagnosis includes, but is not limited to, dehydration, adverse medication reaction, electrolyte disarray, arrhythmia patient is awake and alert, hemodynamically stable and afebrile.  She is oriented EKG obtained in triage demonstrates no arrhythmias, normal intervals.  She was a very difficult stick apparently, and the nurse had a hard time getting her blood, and it had hemolyzed.  Patient does not wish to wait longer for repeat blood draw.  She wishes to be discharged.  She reports that she feels completely back to normal.  She ate a full meal and reports that she has no complaints.  Understands the risks of missing a electrolyte/renal abnormality.  We discussed the reasons for obtaining this lab, the patient does not wish to wait any longer as she feels back to normal.  I advised that she have this rechecked this week by her primary care doctor.  We discussed return precautions and the importance of close outpatient follow-up.  Patient understands and agrees with plan.  She was discharged in stable condition.  This patient was discussed with Dr. Charna Archer who agrees with assessment and plan.  Patient's presentation is most consistent with acute complicated illness / injury requiring diagnostic workup.   Clinical Course as of 01/17/22 1900  Thu Jan 17, 2022  1740 Blood work looks hemoconcentrated.  Patient is able to tolerate fluids and is currently drinking water and eating a sandwich [JP]  1845 Patient does not wish to repeat the BMP which apparently had hemolyzed. She wants to be discharged she reports that she feels completely back to normal [JP]    Clinical Course User Index [JP] Shanitra Phillippi, Clarnce Flock, PA-C     FINAL CLINICAL IMPRESSION(S) / ED DIAGNOSES   Final diagnoses:  Lightheadedness  Dehydration     Rx / DC Orders   ED Discharge Orders     None        Note:  This  document was prepared using Dragon voice recognition software and may include unintentional dictation errors.   [name] 01/17/22 1900    Blake Divine, MD 01/17/22 2356

## 2022-01-24 ENCOUNTER — Ambulatory Visit
Admission: RE | Admit: 2022-01-24 | Discharge: 2022-01-24 | Disposition: A | Discharge status: Home / Self Care | Payer: BC Managed Care – PPO | Source: Ambulatory Visit | Attending: Family Medicine | Admitting: Family Medicine

## 2022-01-24 DIAGNOSIS — Z1231 Encounter for screening mammogram for malignant neoplasm of breast: Secondary | ICD-10-CM | POA: Diagnosis not present

## 2022-03-28 DIAGNOSIS — I10 Essential (primary) hypertension: Secondary | ICD-10-CM | POA: Diagnosis not present

## 2022-03-29 DIAGNOSIS — E876 Hypokalemia: Secondary | ICD-10-CM | POA: Diagnosis not present

## 2022-03-29 DIAGNOSIS — R7303 Prediabetes: Secondary | ICD-10-CM | POA: Diagnosis not present

## 2022-03-29 DIAGNOSIS — I10 Essential (primary) hypertension: Secondary | ICD-10-CM | POA: Diagnosis not present

## 2022-03-29 DIAGNOSIS — E669 Obesity, unspecified: Secondary | ICD-10-CM | POA: Diagnosis not present

## 2022-05-14 ENCOUNTER — Ambulatory Visit: Payer: BC Managed Care – PPO | Admitting: Family Medicine

## 2022-06-03 DIAGNOSIS — R7303 Prediabetes: Secondary | ICD-10-CM | POA: Diagnosis not present

## 2022-06-03 DIAGNOSIS — D751 Secondary polycythemia: Secondary | ICD-10-CM | POA: Diagnosis not present

## 2022-06-03 DIAGNOSIS — R030 Elevated blood-pressure reading, without diagnosis of hypertension: Secondary | ICD-10-CM | POA: Diagnosis not present

## 2022-06-04 DIAGNOSIS — I10 Essential (primary) hypertension: Secondary | ICD-10-CM | POA: Diagnosis not present

## 2022-06-04 DIAGNOSIS — D751 Secondary polycythemia: Secondary | ICD-10-CM | POA: Diagnosis not present

## 2022-06-04 DIAGNOSIS — R7303 Prediabetes: Secondary | ICD-10-CM | POA: Diagnosis not present

## 2022-06-04 DIAGNOSIS — E669 Obesity, unspecified: Secondary | ICD-10-CM | POA: Diagnosis not present

## 2022-06-24 DIAGNOSIS — G5603 Carpal tunnel syndrome, bilateral upper limbs: Secondary | ICD-10-CM | POA: Diagnosis not present

## 2022-06-24 DIAGNOSIS — R29898 Other symptoms and signs involving the musculoskeletal system: Secondary | ICD-10-CM | POA: Diagnosis not present

## 2022-06-24 DIAGNOSIS — R200 Anesthesia of skin: Secondary | ICD-10-CM | POA: Diagnosis not present

## 2022-11-04 ENCOUNTER — Ambulatory Visit: Payer: BC Managed Care – PPO | Admitting: Family Medicine

## 2022-11-07 ENCOUNTER — Ambulatory Visit: Payer: BC Managed Care – PPO | Admitting: Family Medicine

## 2022-11-07 ENCOUNTER — Encounter: Payer: Self-pay | Admitting: Family Medicine

## 2022-11-07 ENCOUNTER — Other Ambulatory Visit: Payer: Self-pay | Admitting: Family Medicine

## 2022-11-07 VITALS — BP 136/79 | HR 58 | Temp 96.9°F | Ht 61.0 in | Wt 195.8 lb

## 2022-11-07 DIAGNOSIS — R7303 Prediabetes: Secondary | ICD-10-CM

## 2022-11-07 DIAGNOSIS — I10 Essential (primary) hypertension: Secondary | ICD-10-CM | POA: Diagnosis not present

## 2022-11-07 DIAGNOSIS — G5621 Lesion of ulnar nerve, right upper limb: Secondary | ICD-10-CM

## 2022-11-07 DIAGNOSIS — Z7689 Persons encountering health services in other specified circumstances: Secondary | ICD-10-CM | POA: Diagnosis not present

## 2022-11-07 DIAGNOSIS — Z1159 Encounter for screening for other viral diseases: Secondary | ICD-10-CM

## 2022-11-07 DIAGNOSIS — R29818 Other symptoms and signs involving the nervous system: Secondary | ICD-10-CM | POA: Diagnosis not present

## 2022-11-07 DIAGNOSIS — Z0000 Encounter for general adult medical examination without abnormal findings: Secondary | ICD-10-CM

## 2022-11-07 DIAGNOSIS — E7800 Pure hypercholesterolemia, unspecified: Secondary | ICD-10-CM

## 2022-11-07 MED ORDER — VALSARTAN 80 MG PO TABS: 80.0000 mg | ORAL_TABLET | Freq: Every day | ORAL | 3 refills | Status: DC

## 2022-11-07 NOTE — Patient Instructions (Addendum)
Thank you for coming to the office today.  Stop Chlorthalidone 25mg  daily, this fluid pill is too strong and can cause the dizziness and it may be lowering Potassium too much  Switch to Valsartan 80mg  once daily Caution with rare reaction angioedema swelling lips tongue face, stop med and seek help if this happens. It raises Potassium, so this would mean you do not need to eat as much potassium rich foods in diet  A1c 6.3, previously, keep improving lifestyle low carb low sugar and work on cardio exercise low impact.  --------------------------------------  [facility] Address: [address] [place], [place], [place] [address] Phone: [phone]  Referral sent via fax today. Stay tuned to hear back from them, they should call you to schedule initial sleep study, likely a home test and then if abnormal - they will arrange the 2nd part of the sleep study in the lab with the CPAP machine.  If there is any issue with this company, for example not covered by insurance or other problem, please notify us and they may do so a well - we will need to change the location of the referral.   DUE for FASTING BLOOD WORK (no food or drink after midnight before the lab appointment, only water or coffee without cream/sugar on the morning of)  SCHEDULE "Lab Only" visit in the morning at the clinic for lab draw in 4-6 WEEKS  - Make sure Lab Only appointment is at about 1 week before your next appointment, so that results will be available  For Lab Results, once available within 2-3 days of blood draw, you can can log in to MyChart online to view your results and a brief explanation. Also, we can discuss results at next follow-up visit.   Please schedule a Follow-up Appointment to: Return for 4-6 weeks fasting lab only then 1 week later Annual Physical.  If you have any other questions or concerns, please feel free to call the office or send a message through MyChart. You may also  schedule an earlier appointment if necessary.  Additionally, you may be receiving a survey about your experience at our office within a few days to 1 week by e-mail or mail. We value your feedback.  Saralyn Pilar, DO [facility] [place]

## 2022-11-07 NOTE — Assessment & Plan Note (Signed)
Prior A1c 6.3 Due for labs next visit Counseling on diet lifestyle modification No medication at this time

## 2022-11-07 NOTE — Progress Notes (Signed)
Subjective:    Patient ID: Joyce Rose, female    DOB: [birth year], 52 y.o.   MRN: [id number]  Joyce Rose is a 52 y.o. female presenting on 11/07/2022 for Establish Care (Pt states she is allergic to cranberries not on allergy list)   HPI  CHRONIC HTN: Reports higher BP >1 year, no prior meds until last year, has been started on Chlorthalidone 25mg  Current Meds - Chlorthalidone 25mg  daily Has had episode of low potassium near syncope 12/2021 hypokalemia She has improved her K intake  in diet Never on ACEi ARB or CCB before Reports good compliance, took meds today. Tolerating well, w/o complaints. 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 She saw [facility] and given cortisone injection S/p Right Carpal Tunnel and Right Cubital Tunnel Release She has paresthesia numbness and pain still  Pre Diabetes Reports last dose A1c 6.3 05/2022 Meds: No medication Lifestyle: - Diet (goal to improve low carb low sugar)  - Exercise (goal to improve, she slowed down due to paresthesia) Denies hypoglycemia, polyuria, visual changes, numbness or tingling.  Erythrocytosis Last Hgb 16 Never smoker  Morbid Obesity BMI >37   Epworth Sleepiness Scale Total Score: 18 Sitting and reading - 3 Watching TV - 3 Sitting inactive in a public place - 2 As a passenger in a car for an hour without a break - 3 Lying down to rest in the afternoon when circumstances permit - 3 Sitting and talking to someone - 0 Sitting quietly after a lunch without alcohol - 3 In a car, while stopped for a few minutes in traffic - 1   STOP-Bang OSA scoring Snoring yes   Tiredness yes   Observed apneas yes   Pressure HTN yes   BMI > 35 kg/m2 yes   Age > 50  yes   Neck (female >17 in; Female >16 in)  no   Gender female no   OSA risk  low (0-2)  OSA risk intermediate (3-4)  OSA risk high (5+)  Total:  6 high risk        11/07/2022   10:06 AM  Depression screen PHQ 2/9  Decreased Interest 0  Down, Depressed, Hopeless 0  PHQ - 2 Score 0  Altered sleeping 0  Tired, decreased energy 1  Change in appetite 1  Feeling bad or failure about yourself  0  Trouble concentrating 1  Moving slowly or fidgety/restless 0  Suicidal thoughts 0  PHQ-9 Score 3  Difficult doing work/chores Not difficult at all    Past Medical History:  Diagnosis Date   Hypertension    Neuropathy    Past Surgical History:  Procedure Laterality Date   ABDOMINAL HYSTERECTOMY     CHOLECYSTECTOMY  2012   Social History   Socioeconomic History   Marital status: Married    Spouse name: Not on file   Number of children: Not on file   Years of education: Not on file   Highest education level: Not on file  Occupational History   Not on file  Tobacco Use   Smoking status: Never   Smokeless tobacco: Never  Vaping Use   Vaping status: Not on file  Substance and Sexual Activity   Alcohol use: Never   Drug use: Never   Sexual activity: Yes  Other Topics Concern   Not on file  Social History Narrative   Not on file   Social Determinants of Health   Financial Resource Strain: Low Risk  (11/07/2022)   Overall Financial Resource Strain (CARDIA)    Difficulty of Paying Living Expenses: Not hard at all  Food Insecurity: No Food Insecurity (11/07/2022)   Hunger Vital Sign    Worried About Running Out of Food in the Last Year: Never true    Ran Out of Food in the Last Year: Never true  Transportation Needs: No Transportation Needs (11/07/2022)   PRAPARE - Administrator, Civil Service (Medical): No    Lack of Transportation (Non-Medical): No  Physical Activity: Inactive (11/07/2022)   Exercise Vital Sign    Days of Exercise per Week: 0 days    Minutes of Exercise per Session: 0 min  Stress: Stress Concern Present (11/07/2022)    [employer] of Occupational Health - Occupational Stress Questionnaire    Feeling of Stress : To some extent  Social Connections: Moderately Integrated (11/07/2022)   Social Connection and Isolation Panel [NHANES]    Frequency of Communication with Friends and Family: More than three times a week    Frequency of Social Gatherings with Friends and Family: More than three times a week    Attends Religious Services: More than 4 times per year    Active Member of [employer] or Organizations: No    Attends Banker Meetings: Never    Marital Status: Married  Catering manager Violence: Not At Risk (11/07/2022)   Humiliation, Afraid, Rape, and Kick questionnaire    Fear of Current or Ex-Partner: No    Emotionally Abused: No    Physically Abused: No    Sexually Abused: No   Family History  Problem Relation Age of Onset   Heart disease Father    Breast cancer Neg Hx    Current Outpatient Medications on File Prior to Visit  Medication Sig   acetaminophen (TYLENOL) 500 MG tablet Take by mouth.   cyanocobalamin (VITAMIN B12) 1000 MCG tablet Take by mouth.   gabapentin (NEURONTIN) 100 MG capsule Take 100 mg by mouth daily. She also takes 300mg  at night occasionally.   gabapentin (NEURONTIN) 300 MG capsule Take 300 mg by mouth at bedtime. AS NEEDED   ibuprofen (ADVIL) 600 MG tablet Take by mouth.   No current facility-administered medications on file prior to visit.    Review of Systems Per HPI unless specifically indicated above     Objective:    BP 136/79   Pulse (!) 58   Temp (!) 96.9 F (36.1 C) (Oral)   Ht 5\' 1"  (1.549 m)   Wt 195 lb 12.8 oz (88.8 kg)   SpO2 98%   BMI 37.00 kg/m   Wt Readings from Last 3 Encounters:  11/07/22 195 lb 12.8 oz (88.8 kg)  01/17/22 200 lb (90.7 kg)    Physical Exam Vitals and nursing note reviewed.  Constitutional:      General: She is not in acute distress.    Appearance: Normal appearance. She is well-developed. She is not  diaphoretic.     Comments: Well-appearing, comfortable, cooperative  HENT:     Head: Normocephalic and atraumatic.  Eyes:     General:        Right eye: No discharge.        Left eye: No discharge.     Conjunctiva/sclera: Conjunctivae normal.  Cardiovascular:     Rate and Rhythm: Normal rate.  Pulmonary:  Effort: Pulmonary effort is normal.  Skin:    General: Skin is warm and dry.     Findings: No erythema or rash.  Neurological:     Mental Status: She is alert and oriented to person, place, and time.  Psychiatric:        Mood and Affect: Mood normal.        Behavior: Behavior normal.        Thought Content: Thought content normal.     Comments: Well groomed, good eye contact, normal speech and thoughts      Results for orders placed or performed during the hospital encounter of 01/17/22  CBC  Result Value Ref Range   WBC 16.4 (H) 4.0 - 10.5 K/uL   RBC 5.29 (H) 3.87 - 5.11 MIL/uL   Hemoglobin 15.9 (H) 12.0 - 15.0 g/dL   HCT 41.3 (H) 24.4 - 01.0 %   MCV 92.1 80.0 - 100.0 fL   MCH 30.1 26.0 - 34.0 pg   MCHC 32.6 30.0 - 36.0 g/dL   RDW 27.2 53.6 - 64.4 %   Platelets 212 150 - 400 K/uL   nRBC 0.0 0.0 - 0.2 %  Urinalysis, Routine w reflex microscopic  Result Value Ref Range   Color, Urine YELLOW (A) YELLOW   APPearance CLEAR (A) CLEAR   Specific Gravity, Urine 1.015 1.005 - 1.030   pH 5.0 5.0 - 8.0   Glucose, UA NEGATIVE NEGATIVE mg/dL   Hgb urine dipstick NEGATIVE NEGATIVE   Bilirubin Urine NEGATIVE NEGATIVE   Ketones, ur NEGATIVE NEGATIVE mg/dL   Protein, ur NEGATIVE NEGATIVE mg/dL   Nitrite NEGATIVE NEGATIVE   Leukocytes,Ua NEGATIVE NEGATIVE  Troponin I (High Sensitivity)  Result Value Ref Range   Troponin I (High Sensitivity) 6 <18 ng/L      Assessment & Plan:   Problem List Items Addressed This Visit     Essential hypertension - Primary    Controlled BP but has some side effects on thiazide, dizzy and low K - Home BP readings  No known complications     Plan:  1. Discontinue Chlorthalidone 25mg  2. Start Valsartan 80mg  daily ARB, review benefit risk side effects angioedema possibility 3. Encourage improved lifestyle - low sodium diet, regular exercise 3. Continue monitor BP outside office, bring readings to next visit, if persistently >140/90 or new symptoms notify office sooner  Check chemistry at visit 4-6 week      Relevant Medications   valsartan (DIOVAN) 80 MG tablet   Morbid obesity (HCC)    BMI >37 with comorbid pre diabetes HYPERTENSION       Pre-diabetes    Prior A1c 6.3 Due for labs next visit Counseling on diet lifestyle modification No medication at this time      Ulnar neuropathy of right upper extremity   Relevant Medications   gabapentin (NEURONTIN) 100 MG capsule   gabapentin (NEURONTIN) 300 MG capsule   Other Visit Diagnoses     Suspected sleep apnea       Encounter to establish care with new doctor           Establish care w/ new PCP   New problem with Persistent clinical concern for suspected obstructive sleep apnea given reported symptoms with witnessed apnea, snoring and sleep disturbance, fatigue excessive sleepiness. - Screening: ESS score 18 / STOP-Bang Score 6 high risk - Neck Circumference: < 16" - Co-morbidities: HTN, Obesity  Likely erythrocytosis as a secondary result  Plan: 1. Discussion on initial diagnosis and testing  for OSA, risk factors, management, complications 2. Agree to proceed with sleep study testing based on clinical concerns - will fax referral to [facility] - to arrange initial PSG home vs in sleep lab and further evaluation.   Meds ordered this encounter  Medications   valsartan (DIOVAN) 80 MG tablet    Sig: Take 1 tablet (80 mg total) by mouth daily.    Dispense:  90 tablet    Refill:  3      Follow up plan: Return for 4-6 weeks fasting lab only then 1 week later Annual Physical.  Future labs 12/17/22 CMET CBC Lipid A1c Hep C  TSH  Saralyn Pilar, DO [facility] 11/07/2022, 10:26 AM

## 2022-11-07 NOTE — Assessment & Plan Note (Signed)
Controlled BP but has some side effects on thiazide, dizzy and low K - Home BP readings  No known complications    Plan:  1. Discontinue Chlorthalidone 25mg  2. Start Valsartan 80mg  daily ARB, review benefit risk side effects angioedema possibility 3. Encourage improved lifestyle - low sodium diet, regular exercise 3. Continue monitor BP outside office, bring readings to next visit, if persistently >140/90 or new symptoms notify office sooner  Check chemistry at visit 4-6 week

## 2022-11-07 NOTE — Assessment & Plan Note (Signed)
BMI >37 with comorbid pre diabetes HYPERTENSION

## 2022-12-17 ENCOUNTER — Other Ambulatory Visit: Payer: BC Managed Care – PPO

## 2022-12-17 DIAGNOSIS — Z0000 Encounter for general adult medical examination without abnormal findings: Secondary | ICD-10-CM

## 2022-12-17 DIAGNOSIS — Z1159 Encounter for screening for other viral diseases: Secondary | ICD-10-CM

## 2022-12-17 DIAGNOSIS — R7303 Prediabetes: Secondary | ICD-10-CM

## 2022-12-17 DIAGNOSIS — I10 Essential (primary) hypertension: Secondary | ICD-10-CM

## 2022-12-17 DIAGNOSIS — E7800 Pure hypercholesterolemia, unspecified: Secondary | ICD-10-CM

## 2022-12-24 ENCOUNTER — Encounter: Payer: BC Managed Care – PPO | Admitting: Family Medicine

## 2023-02-14 ENCOUNTER — Emergency Department: Payer: BC Managed Care – PPO

## 2023-02-14 ENCOUNTER — Other Ambulatory Visit: Payer: Self-pay

## 2023-02-14 ENCOUNTER — Emergency Department
Admission: EM | Admit: 2023-02-14 | Discharge: 2023-02-14 | Disposition: A | Discharge status: Home / Self Care | Payer: BC Managed Care – PPO | Attending: Emergency Medicine | Admitting: Emergency Medicine

## 2023-02-14 DIAGNOSIS — M7652 Patellar tendinitis, left knee: Secondary | ICD-10-CM | POA: Diagnosis not present

## 2023-02-14 DIAGNOSIS — W108XXA Fall (on) (from) other stairs and steps, initial encounter: Secondary | ICD-10-CM | POA: Diagnosis not present

## 2023-02-14 DIAGNOSIS — S80212A Abrasion, left knee, initial encounter: Secondary | ICD-10-CM | POA: Diagnosis not present

## 2023-02-14 DIAGNOSIS — M7651 Patellar tendinitis, right knee: Secondary | ICD-10-CM | POA: Diagnosis not present

## 2023-02-14 DIAGNOSIS — M25562 Pain in left knee: Secondary | ICD-10-CM | POA: Diagnosis not present

## 2023-02-14 DIAGNOSIS — S80211A Abrasion, right knee, initial encounter: Secondary | ICD-10-CM | POA: Diagnosis not present

## 2023-02-14 DIAGNOSIS — S8991XA Unspecified injury of right lower leg, initial encounter: Secondary | ICD-10-CM | POA: Diagnosis not present

## 2023-02-14 DIAGNOSIS — I10 Essential (primary) hypertension: Secondary | ICD-10-CM | POA: Diagnosis not present

## 2023-02-14 DIAGNOSIS — Y92480 Sidewalk as the place of occurrence of the external cause: Secondary | ICD-10-CM | POA: Insufficient documentation

## 2023-02-14 DIAGNOSIS — Y9301 Activity, walking, marching and hiking: Secondary | ICD-10-CM | POA: Diagnosis not present

## 2023-02-14 DIAGNOSIS — M25561 Pain in right knee: Secondary | ICD-10-CM | POA: Diagnosis not present

## 2023-02-14 DIAGNOSIS — W19XXXA Unspecified fall, initial encounter: Secondary | ICD-10-CM

## 2023-02-14 NOTE — ED Triage Notes (Signed)
Pt to ed from home via POV for knee pain post fall. Pt tripped over a ledge when she stepped up on a sidewalk. Pt denies hitting her head and no LOC. Pt is caox4, in no acute distress and in a wheel chair in triage. Knees have very minimal abrasion.

## 2023-02-14 NOTE — ED Provider Notes (Signed)
[facility] Emergency Department Provider Note     Event Date/Time   First MD Initiated Contact with Patient 02/14/23 2048     (approximate)   History   Knee Injury (both)   HPI  Joyce Rose is a 52 y.o. female with a history of hypertension and neuropathy presents to the ED following a fall today.  Patient reports she was walking up a sidewalk when she missed stepped and fell onto both her knees.  She denies hitting her head and LOC.  He complains of bilateral knee pain.  Noted swelling and abrasions to the knees.  Other complaints.      Physical Exam   Triage Vital Signs: ED Triage Vitals  Encounter Vitals Group     BP 02/14/23 2014 (!) 183/93     Systolic BP Percentile --      Diastolic BP Percentile --      Pulse Rate 02/14/23 2014 (!) 59     Resp 02/14/23 2014 16     Temp 02/14/23 2014 97.9 F (36.6 C)     Temp src --      SpO2 02/14/23 2014 99 %     Weight --      Height 02/14/23 2012 5\' 2"  (1.575 m)     Head Circumference --      Peak Flow --      Pain Score --      Pain Loc --      Pain Education --      Exclude from Growth Chart --     Most recent vital signs: Vitals:   02/14/23 2014  BP: (!) 183/93  Pulse: (!) 59  Resp: 16  Temp: 97.9 F (36.6 C)  SpO2: 99%    General Awake, no distress.  Well-appearing HEENT NCAT. PERRL. EOMI. No rhinorrhea. Mucous membranes are moist.  CV:  Good peripheral perfusion.  RESP:  Normal effort.  ABD:  No distention.  Other:  Mild edema to anterior aspect of bilateral knees.  Small superficial l abrasions on anterior patella bilaterally.  No active bleeding.   ED Results / Procedures / Treatments   Labs (all labs ordered are listed, but only abnormal results are displayed) Labs Reviewed - No data to display   RADIOLOGY  I personally viewed and evaluated these images as part of my medical decision making, as well as reviewing the written report by the radiologist.  ED  Provider Interpretation: Left knee and right knee revealed no acute bony abnormalities.  DG Knee Complete 4 Views Left  Result Date: 02/14/2023 CLINICAL DATA:  Knee pain after fall. EXAM: LEFT KNEE - COMPLETE 4+ VIEW COMPARISON:  None Available. FINDINGS: No evidence of fracture, dislocation, or joint effusion. Normal alignment and joint spaces. Small patellar tendon enthesophyte. Soft tissues are unremarkable. IMPRESSION: No fracture or subluxation of the left knee. Electronically Signed   By: Narda Rutherford M.D.   On: 02/14/2023 20:57   DG Knee Complete 4 Views Right  Result Date: 02/14/2023 CLINICAL DATA:  Knee pain after fall. EXAM: RIGHT KNEE - COMPLETE 4+ VIEW COMPARISON:  None Available. FINDINGS: No evidence of fracture, dislocation, or joint effusion. Alignment and joint spaces are normal. Small quadriceps and patellar tendon enthesophytes. Soft tissues are unremarkable. IMPRESSION: No fracture or subluxation of the right knee. Electronically Signed   By: Narda Rutherford M.D.   On: 02/14/2023 20:57    PROCEDURES:  Critical Care performed: No  Procedures   MEDICATIONS  ORDERED IN ED: Medications - No data to display   IMPRESSION / MDM / ASSESSMENT AND PLAN / ED COURSE  I reviewed the triage vital signs and the nursing notes.                               52 y.o. female presents to the emergency department for evaluation and treatment of bilateral knee pain following fall. See HPI for further details.   Differential diagnosis includes, but is not limited to fracture, contusion, effusion  Patient's presentation is most consistent with acute complicated illness / injury requiring diagnostic workup.  Patient is alert and oriented.  She is hemodynamically stable.  Physical exam finding is as stated above and overall benign.  Bilateral knee x-rays are reassuring.  RICE therapy education provided for patient for at home use.  The patient is in stable and satisfactory condition  for discharge home. Encouraged to follow up with her primary care for further management as needed. ED precautions discussed. All questions and concerns were addressed during this ED visit.     FINAL CLINICAL IMPRESSION(S) / ED DIAGNOSES   Final diagnoses:  Fall, initial encounter  Acute pain of both knees    Rx / DC Orders   ED Discharge Orders     None        Note:  This document was prepared using Dragon voice recognition software and may include unintentional dictation errors.    Romeo Apple, Maleiah Dula A, PA-C 02/15/23 0019    Phineas Semen, MD 02/15/23 [phone]

## 2023-02-14 NOTE — Discharge Instructions (Addendum)
You were evaluated in the ED following a fall and knee pain.  Your knee x-rays are normal.   Get plenty of rest, apply ice to the affected area and elevate your legs above heart level as much as possible to help with swelling.  Take Ibuprofen for pain as needed.

## 2023-03-05 ENCOUNTER — Other Ambulatory Visit: Payer: Self-pay

## 2023-03-05 DIAGNOSIS — E7800 Pure hypercholesterolemia, unspecified: Secondary | ICD-10-CM

## 2023-03-05 DIAGNOSIS — Z1159 Encounter for screening for other viral diseases: Secondary | ICD-10-CM

## 2023-03-05 DIAGNOSIS — I10 Essential (primary) hypertension: Secondary | ICD-10-CM

## 2023-03-05 DIAGNOSIS — R7303 Prediabetes: Secondary | ICD-10-CM

## 2023-03-05 DIAGNOSIS — Z0000 Encounter for general adult medical examination without abnormal findings: Secondary | ICD-10-CM

## 2023-03-06 ENCOUNTER — Other Ambulatory Visit: Payer: BC Managed Care – PPO

## 2023-03-06 DIAGNOSIS — I10 Essential (primary) hypertension: Secondary | ICD-10-CM | POA: Diagnosis not present

## 2023-03-06 DIAGNOSIS — R7303 Prediabetes: Secondary | ICD-10-CM | POA: Diagnosis not present

## 2023-03-06 DIAGNOSIS — Z1159 Encounter for screening for other viral diseases: Secondary | ICD-10-CM | POA: Diagnosis not present

## 2023-03-06 DIAGNOSIS — E7800 Pure hypercholesterolemia, unspecified: Secondary | ICD-10-CM | POA: Diagnosis not present

## 2023-03-07 LAB — LIPID PANEL
Cholesterol: 197 mg/dL (ref <200)
HDL: 77 mg/dL (ref >=50)
LDL Cholesterol (Calc): 103 mg/dL — ABNORMAL HIGH
Non-HDL Cholesterol (Calc): 120 mg/dL (ref <130)
Total CHOL/HDL Ratio: 2.6 (calc) (ref <5.0)
Triglycerides: 79 mg/dL (ref <150)

## 2023-03-07 LAB — CBC WITH DIFFERENTIAL/PLATELET
Absolute Lymphocytes: 1299 {cells}/uL (ref 850–3900)
Absolute Monocytes: 507 {cells}/uL (ref 200–950)
Basophils Absolute: 26 {cells}/uL (ref 0–200)
Basophils Relative: 0.3 %
Eosinophils Absolute: 232 {cells}/uL (ref 15–500)
Eosinophils Relative: 2.7 %
HCT: 49.7 % — ABNORMAL HIGH (ref 35.0–45.0)
Hemoglobin: 16.4 g/dL — ABNORMAL HIGH (ref 11.7–15.5)
MCH: 30.1 pg (ref 27.0–33.0)
MCHC: 33 g/dL (ref 32.0–36.0)
MCV: 91.2 fL (ref 80.0–100.0)
MPV: 10.6 fL (ref 7.5–12.5)
Monocytes Relative: 5.9 %
Neutro Abs: 6536 {cells}/uL (ref 1500–7800)
Neutrophils Relative %: 76 %
Platelets: 244 10*3/uL (ref 140–400)
RBC: 5.45 10*6/uL — ABNORMAL HIGH (ref 3.80–5.10)
RDW: 13 % (ref 11.0–15.0)
Total Lymphocyte: 15.1 %
WBC: 8.6 10*3/uL (ref 3.8–10.8)

## 2023-03-07 LAB — COMPLETE METABOLIC PANEL WITH GFR
AG Ratio: 1.6 (calc) (ref 1.0–2.5)
ALT: 34 U/L — ABNORMAL HIGH (ref 6–29)
AST: 22 U/L (ref 10–35)
Albumin: 4.5 g/dL (ref 3.6–5.1)
Alkaline phosphatase (APISO): 99 U/L (ref 37–153)
BUN: 15 mg/dL (ref 7–25)
CO2: 31 mmol/L (ref 20–32)
Calcium: 9.9 mg/dL (ref 8.6–10.4)
Chloride: 104 mmol/L (ref 98–110)
Creat: 0.8 mg/dL (ref 0.50–1.03)
Globulin: 2.8 g/dL (ref 1.9–3.7)
Glucose, Bld: 101 mg/dL — ABNORMAL HIGH (ref 65–99)
Potassium: 4.3 mmol/L (ref 3.5–5.3)
Sodium: 143 mmol/L (ref 135–146)
Total Bilirubin: 0.5 mg/dL (ref 0.2–1.2)
Total Protein: 7.3 g/dL (ref 6.1–8.1)
eGFR: 88 mL/min/{1.73_m2} (ref >=60)

## 2023-03-07 LAB — HEMOGLOBIN A1C
Hgb A1c MFr Bld: 6.2 %{Hb} — ABNORMAL HIGH (ref <5.7)
Mean Plasma Glucose: 131 mg/dL
eAG (mmol/L): 7.3 mmol/L

## 2023-03-07 LAB — TSH: TSH: 0.87 m[IU]/L

## 2023-03-07 LAB — HEPATITIS C ANTIBODY: Hepatitis C Ab: NONREACTIVE

## 2023-03-13 ENCOUNTER — Ambulatory Visit: Payer: BC Managed Care – PPO | Admitting: Family Medicine

## 2023-03-13 ENCOUNTER — Encounter: Payer: Self-pay | Admitting: Family Medicine

## 2023-03-13 VITALS — BP 130/84 | HR 64 | Ht 62.0 in | Wt 194.0 lb

## 2023-03-13 DIAGNOSIS — R7303 Prediabetes: Secondary | ICD-10-CM

## 2023-03-13 DIAGNOSIS — I10 Essential (primary) hypertension: Secondary | ICD-10-CM

## 2023-03-13 DIAGNOSIS — Z1231 Encounter for screening mammogram for malignant neoplasm of breast: Secondary | ICD-10-CM | POA: Diagnosis not present

## 2023-03-13 DIAGNOSIS — E7800 Pure hypercholesterolemia, unspecified: Secondary | ICD-10-CM

## 2023-03-13 DIAGNOSIS — R29818 Other symptoms and signs involving the nervous system: Secondary | ICD-10-CM

## 2023-03-13 DIAGNOSIS — Z0000 Encounter for general adult medical examination without abnormal findings: Secondary | ICD-10-CM

## 2023-03-13 NOTE — Patient Instructions (Addendum)
Thank you for coming to the office today.  For Mammogram screening for breast cancer   Call the Imaging Center below anytime to schedule your own appointment now that order has been placed.  [facility] at [facility] [address], [address] [place], [place] [address] Phone: [phone]  -----------------  [facility]  [email or web address]  Check insurance coverage for "Home Sleep Study HST" and then find out other costs by talking to [facility]  Let me know if you are ready for me to order.  Please schedule a Follow-up Appointment to: Return for 6 month PreDM A1c.  If you have any other questions or concerns, please feel free to call the office or send a message through MyChart. You may also schedule an earlier appointment if necessary.  Additionally, you may be receiving a survey about your experience at our office within a few days to 1 week by e-mail or mail. We value your feedback.  Saralyn Pilar, DO [facility], [place]

## 2023-03-13 NOTE — Progress Notes (Signed)
Subjective:    Patient ID: Joyce Rose, female    DOB: 1970/05/08, 52 y.o.   MRN: [id number]  Joyce Rose is a 52 y.o. female presenting on 03/13/2023 for Annual Exam (Hot flashes, fall last month bruised knees, balance feels off , tired more often)   HPI  Discussed the use of AI scribe software for clinical note transcription with the patient, who gave verbal consent to proceed.  History of Present Illness   The patient, with a history of prediabetes and sleep disturbances, presented for her annual check-up.    CHRONIC HTN: Improved BP Current Meds - Valsartan 80mg  daily Reports good compliance, took meds today. Tolerating well, w/o complaints. 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 She saw [facility] and given cortisone injection S/p Right Carpal Tunnel and Right Cubital Tunnel Release She has paresthesia numbness and pain still  Lab result LDL 103   Pre Diabetes Improved from 6.3 > 6.2 She will work with a wellness center to consider supplements and alternative therapy, nutrition Meds: No medication Lifestyle: - Diet (goal to improve low carb low sugar)  - Exercise (goal to improve, she slowed down due to paresthesia) Denies hypoglycemia, polyuria, visual changes, numbness or tingling.   Erythrocytosis Last Hgb 16.4, elevated Never smoker  Hot Flashes vasomotor Already on Gabapentin helps some hot flashes   Morbid Obesity BMI >37  She reported a recent fall, resulting in contusions on both knees but no fractures. The patient also expressed concerns about increasing fatigue and difficulty maintaining balance, which she attributed to her weight. She reported a significant increase in hot flashes, which were severe enough to cause perspiration to fall from her glasses.  The  patient also reported worsening pain in her hands, which she described as almost losing the use of them. She is planning to start therapy at a wellness center, which includes supplements, decompression, and red light healing, in hopes of alleviating the pain.  Suspected Sleep Apnea Excessive Daytime Sleepiness Sleep disturbances with snoring, apnea episode. Regarding her sleep disturbances, the patient had previously been recommended for a sleep study. However, due to the high out-of-pocket cost, she was unable to proceed. She expressed interest in exploring alternative options for a home sleep study.   Epworth Sleepiness Scale Total Score: 18 Sitting and reading - 3 Watching TV - 3 Sitting inactive in a public place - 2 As a passenger in a car for an hour without a break - 3 Lying down to rest in the afternoon when circumstances permit - 3 Sitting and talking to someone - 0 Sitting quietly after a lunch without alcohol - 3 In a car, while stopped for a few minutes in traffic - 1  STOP-Bang OSA scoring Snoring yes   Tiredness yes   Observed apneas yes   Pressure HTN yes   BMI > 35 kg/m2 yes   Age > 50  yes   Neck (female >17 in; Female >16 in)  no   Gender female no   OSA risk low (0-2)  OSA risk intermediate (3-4)  OSA risk high (5+)  Total: 6      Health Maintenance:  Mammogram is due, [facility], ordered today. Last done 1 year ago 01/2022  Cologuard negative 12/25/21, repeat in 2026  The patient had a total abdominal hysterectomy in 2013  due to a large fibroid. She was unsure if her cervix was removed during the procedure, but after reviewing her medical records, it was confirmed that a total abdominal hysterectomy was performed.     03/13/2023    9:08 AM 11/07/2022   10:06 AM  Depression screen PHQ 2/9  Decreased Interest 0 0  Down, Depressed, Hopeless 0 0  PHQ - 2 Score 0 0  Altered sleeping 0 0  Tired, decreased energy 1 1  Change in appetite 0 1  Feeling bad  or failure about yourself  0 0  Trouble concentrating 1 1  Moving slowly or fidgety/restless 0 0  Suicidal thoughts 0 0  PHQ-9 Score 2 3  Difficult doing work/chores  Not difficult at all       03/13/2023    9:08 AM 11/07/2022   10:06 AM  GAD 7 : Generalized Anxiety Score  Nervous, Anxious, on Edge 0 0  Control/stop worrying 0 0  Worry too much - different things 0 0  Trouble relaxing 0 0  Restless 0 0  Easily annoyed or irritable 0 0  Afraid - awful might happen 0 0  Total GAD 7 Score 0 0  Anxiety Difficulty  Not difficult at all     Past Medical History:  Diagnosis Date   Hypertension    Neuropathy    Past Surgical History:  Procedure Laterality Date   CHOLECYSTECTOMY  2012   TOTAL ABDOMINAL HYSTERECTOMY  09/02/2011   Social History   Socioeconomic History   Marital status: Married    Spouse name: Not on file   Number of children: Not on file   Years of education: Not on file   Highest education level: Not on file  Occupational History   Not on file  Tobacco Use   Smoking status: Never   Smokeless tobacco: Never  Vaping Use   Vaping status: Not on file  Substance and Sexual Activity   Alcohol use: Never   Drug use: Never   Sexual activity: Yes  Other Topics Concern   Not on file  Social History Narrative   Not on file   Social Drivers of Health   Financial Resource Strain: Low Risk  (11/07/2022)   Overall Financial Resource Strain (CARDIA)    Difficulty of Paying Living Expenses: Not hard at all  Food Insecurity: No Food Insecurity (11/07/2022)   Hunger Vital Sign    Worried About Running Out of Food in the Last Year: Never true    Ran Out of Food in the Last Year: Never true  Transportation Needs: No Transportation Needs (11/07/2022)   PRAPARE - Administrator, Civil Service (Medical): No    Lack of Transportation (Non-Medical): No  Physical Activity: Inactive (11/07/2022)   Exercise Vital Sign    Days of Exercise per Week: 0 days     Minutes of Exercise per Session: 0 min  Stress: Stress Concern Present (11/07/2022)   [employer] of Occupational Health - Occupational Stress Questionnaire    Feeling of Stress : To some extent  Social Connections: Moderately Integrated (11/07/2022)   Social Connection and Isolation Panel [NHANES]    Frequency of Communication with Friends and Family: More than three times a week    Frequency of Social Gatherings with Friends and Family: More than three times a week    Attends Religious Services: More than 4 times per year    Active Member of [employer] or Organizations: No    Attends [employer]  or Organization Meetings: Never    Marital Status: Married  Catering manager Violence: Not At Risk (11/07/2022)   Humiliation, Afraid, Rape, and Kick questionnaire    Fear of Current or Ex-Partner: No    Emotionally Abused: No    Physically Abused: No    Sexually Abused: No   Family History  Problem Relation Age of Onset   Heart disease Father    Breast cancer Neg Hx    Current Outpatient Medications on File Prior to Visit  Medication Sig   acetaminophen (TYLENOL) 500 MG tablet Take by mouth.   cyanocobalamin (VITAMIN B12) 1000 MCG tablet Take by mouth.   gabapentin (NEURONTIN) 100 MG capsule Take 100 mg by mouth daily. She also takes 300mg  at night occasionally.   gabapentin (NEURONTIN) 300 MG capsule Take 300 mg by mouth at bedtime. AS NEEDED   ibuprofen (ADVIL) 600 MG tablet Take by mouth.   valsartan (DIOVAN) 80 MG tablet Take 1 tablet (80 mg total) by mouth daily.   No current facility-administered medications on file prior to visit.    Review of Systems  Constitutional:  Negative for activity change, appetite change, chills, diaphoresis, fatigue and fever.  HENT:  Negative for congestion and hearing loss.   Eyes:  Negative for visual disturbance.  Respiratory:  Negative for cough, chest tightness, shortness of breath and wheezing.   Cardiovascular:  Negative for chest pain,  palpitations and leg swelling.  Gastrointestinal:  Negative for abdominal pain, constipation, diarrhea, nausea and vomiting.  Genitourinary:  Negative for dysuria, frequency and hematuria.  Musculoskeletal:  Positive for arthralgias. Negative for neck pain.  Skin:  Negative for rash.  Neurological:  Negative for dizziness, weakness, light-headedness, numbness and headaches.  Hematological:  Negative for adenopathy.  Psychiatric/Behavioral:  Positive for sleep disturbance. Negative for behavioral problems and dysphoric mood.    Per HPI unless specifically indicated above     Objective:    BP 130/84   Pulse 64   Ht 5\' 2"  (1.575 m)   Wt 194 lb (88 kg)   BMI 35.48 kg/m   Wt Readings from Last 3 Encounters:  03/13/23 194 lb (88 kg)  11/07/22 195 lb 12.8 oz (88.8 kg)  01/17/22 200 lb (90.7 kg)    Physical Exam Vitals and nursing note reviewed.  Constitutional:      General: She is not in acute distress.    Appearance: She is well-developed. She is obese. She is not diaphoretic.     Comments: Well-appearing, comfortable, cooperative  HENT:     Head: Normocephalic and atraumatic.  Eyes:     General:        Right eye: No discharge.        Left eye: No discharge.     Conjunctiva/sclera: Conjunctivae normal.     Pupils: Pupils are equal, round, and reactive to light.  Neck:     Thyroid: No thyromegaly.  Cardiovascular:     Rate and Rhythm: Normal rate and regular rhythm.     Pulses: Normal pulses.     Heart sounds: Normal heart sounds. No murmur heard. Pulmonary:     Effort: Pulmonary effort is normal. No respiratory distress.     Breath sounds: Normal breath sounds. No wheezing or rales.  Abdominal:     General: Bowel sounds are normal. There is no distension.     Palpations: Abdomen is soft. There is no mass.     Tenderness: There is no abdominal tenderness.  Musculoskeletal:  General: No tenderness. Normal range of motion.     Cervical back: Normal range of motion  and neck supple.     Comments: Upper / Lower Extremities: - Normal muscle tone, strength bilateral upper extremities 5/5, lower extremities 5/5  Lymphadenopathy:     Cervical: No cervical adenopathy.  Skin:    General: Skin is warm and dry.     Findings: No erythema or rash.  Neurological:     Mental Status: She is alert and oriented to person, place, and time.     Comments: Distal sensation intact to light touch all extremities  Psychiatric:        Mood and Affect: Mood normal.        Behavior: Behavior normal.        Thought Content: Thought content normal.     Comments: Well groomed, good eye contact, normal speech and thoughts      PATIENT NAME:  SHONDI, LEONELLI MR#:  [id number] DATE OF BIRTH:  May 04, 1970   DATE OF PROCEDURE:  09/02/2011   PREOPERATIVE DIAGNOSIS: 30 week multifibroid uterus.    POSTOPERATIVE DIAGNOSES: 1.    30 week multifibroid uterus.  2.          Pelvic adhesions.    OPERATIVE PROCEDURES: Exploratory laparotomy with total abdominal hysterectomy and adhesiolysis.    SURGEON: Prentice Docker. DeFrancesco, MD   FIRST ASSISTANT: None.    ANESTHESIA: General endotracheal.    INDICATIONS: Aeisha Reutov is a 52 year old African American female para 0-0-2-0, status post six months of Depo-Lupron therapy with reduction in size of uterine fibroids from 36 weeks to 30 weeks, presents for laparotomy for management of central pelvic mass.    FINDINGS AT SURGERY: Findings at surgery revealed a multifibroid nodular uterus measuring approximately 30 weeks size. The right adnexa was adhesed to the pelvic sidewall and subsequent adhesiolysis had to be performed in order to restore normal anatomy. The left ovary and tube were normal. Multiple adhesions between the omentum and bowel were lysed during the surgical procedure.    DESCRIPTION OF PROCEDURE: Patient was brought to the Operating Room where she was placed in supine position. General endotracheal anesthesia was induced  without difficulty. A ChloraPrep abdominal and perineal prep and drape was performed in the standard fashion. A Foley catheter was placed and was draining clear yellow urine from the bladder. A midline incision was made 1 cm below the umbilicus to the suprapubic region 1 fingerbreadth above the symphysis pubis. The fascia was incised in the midline and this was dissected off of the rectus muscles to identify the midline raphe. The peritoneum was then entered. Exploration of the abdomen reveals the central pelvic mass which was nodular and somewhat mobile. There were adhesions identified posteriorly. Eventually the 30 week size mass was elevated and delivered through the abdominal incision. Richardson retractors were used to facilitate exposure and the hysterectomy was then performed in a modified manner. The left utero-ovarian ligament was doubly clamped, cut, and stick tied using 0 Vicryl. Adjacent adhesions between the omentum and bowel were lysed using Metzenbaum scissors. The round ligament was then doubly clamped and cut with subsequent suture ligation being performed with 0 Vicryl. Posteriorly as the uterus was elevated more out of the pelvis further adhesiolysis had to be performed. Several of the thickened adhesions were taken down with hemostats followed by free ties. Eventually the pelvis was completely elevated out of the pelvis following the adhesiolysis. On the right side the utero-ovarian ligament was doubly clamped, cut,  and stick tied using 0 Vicryl. Again similarly the round ligament and the fallopian tube were crossclamped using Heaney clamps followed by cutting and stick tying. This was done sequentially on both sides to the point of where the uterine arteries were triply clamped and cut. These again were stick tied using 0 Vicryl. The anterior leaf of the peritoneum was opened and the bladder flap was created through sharp dissection. Further clamping, cutting, and stick tying using straight  clamps and 0 Vicryl suture were used. Once we got to the level of the uterosacral ligaments the mass was excised with a scalpel. The remaining cervical stump was grasped with a double-tooth tenaculum. Further adhesiolysis was performed on the right adnexa to mobilize the adhesed right ovary. The ureters were noted to be in normal anatomic position bilaterally and had peristalsis. Next, the cervical stump was then sequentially isolated, clamped, cut, and stick tied with 0 Vicryl suture. This was done as the bladder flap was reflected further off of the cervix. Once we got to the angles of the cervix curved Heaney clamps were used to crossclamp the structure followed by excision with Jorgenson scissors. The angles of the vagina were ligated using Richardson stitch of 0 Vicryl. The intervening vaginal mucosa was reapproximated using simple figure-of-eight sutures of 0 Vicryl. Good hemostasis was obtained. Once this was accomplished the pelvis was copiously irrigated. The irrigant fluid was aspirated. Good hemostasis was noted. Closure of the wound was then accomplished with removal of Balfour retractor. The laps were collected and sponge counts were verified as correct. The incision was closed with 0 Maxon in simple running manner. The subcuticular and subcutaneous tissues were reapproximated using 2-0 Vicryl. The skin was closed with staples. A pressure dressing was applied. Patient was then awakened, mobilized, and taken to recovery room in satisfactory condition. Estimated blood loss was 400 mL. IV fluids were 1700 mL. Urine output was 450 mL.    ____________________________ Prentice Docker. DeFrancesco, MD mad:cms D:         09/03/2011 13:06:00 ET         T:         09/03/2011 13:28:36 ET                      JOB#:  [id number] A. DeFrancesco, MD, <Dictator> Prentice Docker DEFRANCESCO MD ELECTRONICALLY SIGNED 09/10/2011 12:49     Results for orders placed or performed in visit on 03/05/23  CBC with  Differential/Platelet   Collection Time: 03/06/23  9:41 AM  Result Value Ref Range   WBC 8.6 3.8 - 10.8 Thousand/uL   RBC 5.45 (H) 3.80 - 5.10 Million/uL   Hemoglobin 16.4 (H) 11.7 - 15.5 g/dL   HCT 19.1 (H) 47.8 - 29.5 %   MCV 91.2 80.0 - 100.0 fL   MCH 30.1 27.0 - 33.0 pg   MCHC 33.0 32.0 - 36.0 g/dL   RDW 62.1 30.8 - 65.7 %   Platelets 244 140 - 400 Thousand/uL   MPV 10.6 7.5 - 12.5 fL   Neutro Abs 6,536 1,500 - 7,800 cells/uL   Absolute Lymphocytes 1,299 850 - 3,900 cells/uL   Absolute Monocytes 507 200 - 950 cells/uL   Eosinophils Absolute 232 15 - 500 cells/uL   Basophils Absolute 26 0 - 200 cells/uL   Neutrophils Relative % 76 %   Total Lymphocyte 15.1 %   Monocytes Relative 5.9 %   Eosinophils Relative 2.7 %   Basophils Relative 0.3 %  COMPLETE  METABOLIC PANEL WITH GFR   Collection Time: 03/06/23  9:41 AM  Result Value Ref Range   Glucose, Bld 101 (H) 65 - 99 mg/dL   BUN 15 7 - 25 mg/dL   Creat 3.47 4.25 - 9.56 mg/dL   eGFR 88 > OR = 60 LO/VFI/4.33I9   BUN/Creatinine Ratio SEE NOTE: 6 - 22 (calc)   Sodium 143 135 - 146 mmol/L   Potassium 4.3 3.5 - 5.3 mmol/L   Chloride 104 98 - 110 mmol/L   CO2 31 20 - 32 mmol/L   Calcium 9.9 8.6 - 10.4 mg/dL   Total Protein 7.3 6.1 - 8.1 g/dL   Albumin 4.5 3.6 - 5.1 g/dL   Globulin 2.8 1.9 - 3.7 g/dL (calc)   AG Ratio 1.6 1.0 - 2.5 (calc)   Total Bilirubin 0.5 0.2 - 1.2 mg/dL   Alkaline phosphatase (APISO) 99 37 - 153 U/L   AST 22 10 - 35 U/L   ALT 34 (H) 6 - 29 U/L  Hemoglobin A1c   Collection Time: 03/06/23  9:41 AM  Result Value Ref Range   Hgb A1c MFr Bld 6.2 (H) <5.7 % of total Hgb   Mean Plasma Glucose 131 mg/dL   eAG (mmol/L) 7.3 mmol/L  Hepatitis C antibody   Collection Time: 03/06/23  9:41 AM  Result Value Ref Range   Hepatitis C Ab NON-REACTIVE NON-REACTIVE  Lipid panel   Collection Time: 03/06/23  9:41 AM  Result Value Ref Range   Cholesterol 197 <200 mg/dL   HDL 77 > OR = 50 mg/dL   Triglycerides 79 <518  mg/dL   LDL Cholesterol (Calc) 103 (H) mg/dL (calc)   Total CHOL/HDL Ratio 2.6 <5.0 (calc)   Non-HDL Cholesterol (Calc) 120 <130 mg/dL (calc)  TSH   Collection Time: 03/06/23  9:41 AM  Result Value Ref Range   TSH 0.87 mIU/L      Assessment & Plan:   Problem List Items Addressed This Visit     Essential hypertension   Morbid obesity (HCC)   Pre-diabetes   Other Visit Diagnoses       Annual physical exam    -  Primary     Encounter for screening mammogram for malignant neoplasm of breast       Relevant Orders   MM 3D SCREENING MAMMOGRAM BILATERAL BREAST     Suspected sleep apnea         Elevated LDL cholesterol level            Updated Health Maintenance information Reviewed recent lab results with patient Encouraged improvement to lifestyle with diet and exercise Goal of weight loss  Suspected Sleep Apnea Excessive Daytime Sleepiness Persistent clinical concern for suspected obstructive sleep apnea given reported symptoms with witnessed apnea, snoring and sleep disturbance, fatigue excessive sleepiness. - Screening: ESS score 18 / STOP-Bang Score 6 high risk - Neck Circumference: 17" - Co-morbidities: HYPERTENSION, Obesity  Anticipate pursue HST -Consider home sleep study with [facility], pending patient's insurance approval. She will check into it first and let me know if interested to proceed.   Prediabetes A1C improved from 6.3 to 6.2. Goal is to get A1C below 6. -Continue current management and lifestyle modifications. -Check A1C in 6 months.  Elevated Hgb Elevated blood count Hgb 16.4, possibly secondary to sleep apnea causing hypoxia and subsequent erythropoiesis. -Consider donating blood to reduce blood volume.  Hot flashes Patient experiencing significant hot flashes. Currently on Gabapentin which has shown some improvement. -Consider increasing  Gabapentin dose if tolerated and needed. -Explore other potential remedies with wellness  center.  Fatigue Patient reports increased fatigue. Possible underlying causes include weight and potential sleep apnea. -Address potential sleep apnea with home sleep study.  Cancer screening Cologuard negative in 12/2021, repeat in 2026. No Pap smear required due to total abdominal hysterectomy in 2013. -Order mammogram at [facility], patient to call to schedule  General Health Maintenance Declined flu shot and other vaccinations. -Continue to offer vaccinations at future visits.  Follow-up Plan to see patient back in 6 months or sooner if sleep study is completed and treatment initiated.         Orders Placed This Encounter  Procedures   MM 3D SCREENING MAMMOGRAM BILATERAL BREAST    Standing Status:   Future    Expiration Date:   03/12/2024    Reason for Exam (SYMPTOM  OR DIAGNOSIS REQUIRED):   Screening bilateral 3D Mammogram Tomo    Preferred imaging location?:   [facility]    No orders of the defined types were placed in this encounter.    Follow up plan: Return for 6 month PreDM A1c, OSA.  Saralyn Pilar, DO [facility] 03/13/2023, 9:27 AM

## 2023-04-03 ENCOUNTER — Other Ambulatory Visit: Payer: BC Managed Care – PPO

## 2023-04-10 ENCOUNTER — Encounter: Payer: BC Managed Care – PPO | Admitting: Family Medicine

## 2023-09-18 ENCOUNTER — Ambulatory Visit: Payer: Self-pay | Admitting: Family Medicine

## 2023-09-18 ENCOUNTER — Encounter: Payer: Self-pay | Admitting: Family Medicine

## 2023-09-18 VITALS — BP 144/86 | HR 73 | Ht 62.0 in | Wt 199.2 lb

## 2023-09-18 DIAGNOSIS — R29818 Other symptoms and signs involving the nervous system: Secondary | ICD-10-CM

## 2023-09-18 DIAGNOSIS — I10 Essential (primary) hypertension: Secondary | ICD-10-CM | POA: Diagnosis not present

## 2023-09-18 DIAGNOSIS — G4719 Other hypersomnia: Secondary | ICD-10-CM

## 2023-09-18 DIAGNOSIS — R7303 Prediabetes: Secondary | ICD-10-CM

## 2023-09-18 LAB — POCT GLYCOSYLATED HEMOGLOBIN (HGB A1C): Hemoglobin A1C: 5.9 % — AB (ref 4.0–5.6)

## 2023-09-18 MED ORDER — VALSARTAN 160 MG PO TABS: 160.0000 mg | ORAL_TABLET | Freq: Every day | ORAL | 3 refills | Status: AC

## 2023-09-18 NOTE — Patient Instructions (Addendum)
 Thank you for coming to the office today.  Recent Labs    03/06/23 0941 09/18/23 0928  HGBA1C 6.2* 5.9*   Remain off Gabapentin  Try the Alpha Lipoic Acid 600mg  up to 3 times per day for nerve supplement. Can try the Nervive supplement brand name sample first. - May help improve nerve health reduce neuropathy symptoms  [email or web address]  Dose increase Valsartan  from 80 to 160mg , new order sent to pharmacy. Hold the 80s for future.  [facility] home sleep study, call us  or them if questions on arranging this.  Usually out of pocket max $250  Follow up 30-90 days after CPAP machine acquired if this occurs.  Please schedule a Follow-up Appointment to: Return in about 3 months (around 12/19/2023) for 3 month follow-up PreDM A1c, Sleep Study/CPAP, BP.  If you have any other questions or concerns, please feel free to call the office or send a message through MyChart. You may also schedule an earlier appointment if necessary.  Additionally, you may be receiving a survey about your experience at our office within a few days to 1 week by e-mail or mail. We value your feedback.  Marsa Officer, DO [facility], [place]

## 2023-09-18 NOTE — Progress Notes (Signed)
 Subjective:    Patient ID: Joyce Rose, female    DOB: [birth year], 53 y.o.   MRN: [id number]  Joyce Rose is a 53 y.o. female presenting on 09/18/2023 for Pre-Diabetes and Suspected Sleep APnea   HPI  Discussed the use of AI scribe software for clinical note transcription with the patient, who gave verbal consent to proceed.  History of Present Illness   Joyce Rose is a 53 year old female with prediabetes and hypertension who presents for follow-up on blood sugar and blood pressure management.    CHRONIC HTN: Elevated BP.  Lowest BP with 146/85, highest 164/95 Recent stressor with lost job and now just recently found new job.  Current Meds - Valsartan  80mg  daily Reports good compliance, took meds today. Tolerating well, w/o complaints. 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 She saw [facility] and given cortisone injection S/p Right Carpal Tunnel and Right Cubital Tunnel Release She has paresthesia numbness and pain still  The patient also reported worsening pain in her hands, which she described as almost losing the use of them. She is planning to start therapy at a wellness center, which includes supplements, decompression, and red light healing, in hopes of alleviating the pain.  Tried Gabapentin 100mg  up to 300mg , ineffective and some side effects, has discontinued.   Pre Diabetes Morbid Obesity BMI >37 Improved A1c from 6.2 down to 5.9 in past 6 months She has eliminated 2 desserts per day, now only has 1 dessert per day now. She has tried to gradually reduced bread and carb intake. Weight gain +5 lbs, she has improved weight loss, and she had a peak weight up to 208 lbs. Meds: No medication Lifestyle: - Diet (goal to improve low carb low sugar)  - Exercise (goal to improve,  she slowed down due to paresthesia) Denies hypoglycemia, polyuria, visual changes, numbness or tingling.   Suspected Sleep Apnea Excessive Daytime Sleepiness Sleep disturbances with snoring, apnea episode. Regarding her sleep disturbances, the patient had previously been recommended for a sleep study. However, due to the high out-of-pocket cost, she was unable to proceed. She expressed interest in exploring alternative options for a home sleep study.  She said sleep center cost was $800 and she did not pursue HST due to lost job.   Epworth Sleepiness Scale Total Score: 18 Sitting and reading - 3 Watching TV - 3 Sitting inactive in a public place - 2 As a passenger in a car for an hour without a break - 3 Lying down to rest in the afternoon when circumstances permit - 3 Sitting and talking to someone - 0 Sitting quietly after a lunch without alcohol - 3 In a car, while stopped for a few minutes in traffic - 1   STOP-Bang OSA scoring Snoring yes    Tiredness yes    Observed apneas yes    Pressure HTN yes    BMI > 35 kg/m2 yes    Age > 50  yes    Neck (female >17 in; Female >16 in)  no    Gender female no    OSA risk low (0-2)  OSA risk intermediate (3-4)  OSA risk high (5+)   Total: 6            09/18/2023    5:26 PM 03/13/2023  9:08 AM 11/07/2022   10:06 AM  Depression screen PHQ 2/9  Decreased Interest 0 0 0  Down, Depressed, Hopeless 0 0 0  PHQ - 2 Score 0 0 0  Altered sleeping 0 0 0  Tired, decreased energy 0 1 1  Change in appetite 0 0 1  Feeling bad or failure about yourself  0 0 0  Trouble concentrating 0 1 1  Moving slowly or fidgety/restless 0 0 0  Suicidal thoughts 0 0 0  PHQ-9 Score 0 2 3  Difficult doing work/chores Not difficult at all  Not difficult at all       03/13/2023    9:08 AM 11/07/2022   10:06 AM  GAD 7 : Generalized Anxiety Score  Nervous, Anxious, on Edge 0 0  Control/stop worrying 0 0  Worry too much - different things 0 0  Trouble  relaxing 0 0  Restless 0 0  Easily annoyed or irritable 0 0  Afraid - awful might happen 0 0  Total GAD 7 Score 0 0  Anxiety Difficulty  Not difficult at all    Social History   Tobacco Use   Smoking status: Never   Smokeless tobacco: Never  Substance Use Topics   Alcohol use: Never   Drug use: Never    Review of Systems Per HPI unless specifically indicated above     Objective:    BP (!) 144/86 (BP Location: Left Arm, Cuff Size: Normal)   Pulse 73   Ht 5' 2 (1.575 m)   Wt 199 lb 4 oz (90.4 kg)   SpO2 96%   BMI 36.44 kg/m   Wt Readings from Last 3 Encounters:  09/18/23 199 lb 4 oz (90.4 kg)  03/13/23 194 lb (88 kg)  11/07/22 195 lb 12.8 oz (88.8 kg)    Physical Exam Vitals and nursing note reviewed.  Constitutional:      General: She is not in acute distress.    Appearance: She is well-developed. She is obese. She is not diaphoretic.     Comments: Well-appearing, comfortable, cooperative  HENT:     Head: Normocephalic and atraumatic.   Eyes:     General:        Right eye: No discharge.        Left eye: No discharge.     Conjunctiva/sclera: Conjunctivae normal.   Neck:     Thyroid: No thyromegaly.   Cardiovascular:     Rate and Rhythm: Normal rate and regular rhythm.     Heart sounds: Normal heart sounds. No murmur heard. Pulmonary:     Effort: Pulmonary effort is normal. No respiratory distress.     Breath sounds: Normal breath sounds. No wheezing or rales.   Musculoskeletal:        General: Normal range of motion.     Cervical back: Normal range of motion and neck supple.  Lymphadenopathy:     Cervical: No cervical adenopathy.   Skin:    General: Skin is warm and dry.     Findings: No erythema or rash.   Neurological:     Mental Status: She is alert and oriented to person, place, and time.   Psychiatric:        Behavior: Behavior normal.     Comments: Well groomed, good eye contact, normal speech and thoughts     Results for orders  placed or performed in visit on 09/18/23  POCT HgB A1C   Collection Time: 09/18/23  9:28 AM  Result  Value Ref Range   Hemoglobin A1C 5.9 (A) 4.0 - 5.6 %   HbA1c POC (<> result, manual entry)     HbA1c, POC (prediabetic range)     HbA1c, POC (controlled diabetic range)        Assessment & Plan:   Problem List Items Addressed This Visit     Essential hypertension   Relevant Medications   valsartan  (DIOVAN ) 160 MG tablet   Morbid obesity (HCC)   Pre-diabetes - Primary   Relevant Orders   POCT HgB A1C (Completed)   Other Visit Diagnoses       Suspected sleep apnea       Relevant Orders   Home sleep test     Excessive daytime sleepiness       Relevant Orders   Home sleep test        Hypertension Blood pressure remains elevated despite current valsartan  dosage. Stress and poor sleep quality may contribute to resistant hypertension. Suspected OSA is likely underlying factor. - Increase valsartan  dosage to 160 mg daily. - Monitor blood pressure regularly. - Order home sleep study through [facility].  Suspected Obstructive Sleep Apnea (OSA) - Co-morbidities: HYPERTENSION, Obesity Interested in home sleep study due to affordability. Poor sleep quality may contribute to hypertension. - Order home sleep study through [facility]. - Coordinate follow-up based on sleep study results. - If diagnosed with OSA, initiate CPAP therapy and follow up in 30 to 90 days.  Peripheral Neuropathy Gabapentin discontinued due to ineffectiveness and side effects. Open to alternative treatments. - Discontinue gabapentin. - Initiate alpha lipoic acid 600 mg up to three times daily. - Provide samples of Nervive supplement.  Prediabetes HbA1c improved to 5.9% with lifestyle modifications. No medication required. - Continue dietary modifications. - Monitor HbA1c levels regularly.  General Health Maintenance Weight decreased from 208 lbs to 199 lbs with lifestyle changes. -  Encourage continued lifestyle modifications.        Route chart to [facility] for Order [employer]  Orders Placed This Encounter  Procedures   POCT HgB A1C   Home sleep test    Scheduling Instructions:     [facility] HST    Where should this test be performed::   Other    Meds ordered this encounter  Medications   valsartan  (DIOVAN ) 160 MG tablet    Sig: Take 1 tablet (160 mg total) by mouth daily.    Dispense:  90 tablet    Refill:  3    Dose increase from Valsartan  80 to 160    Follow up plan: Return in about 3 months (around 12/19/2023) for 3 month follow-up PreDM A1c, Sleep Study/CPAP, BP.   Marsa Officer, DO [facility] 09/18/2023, 9:31 AM

## 2023-10-21 ENCOUNTER — Encounter: Payer: Self-pay | Admitting: Family Medicine

## 2023-10-28 ENCOUNTER — Ambulatory Visit: Payer: Self-pay

## 2023-11-28 ENCOUNTER — Telehealth: Payer: Self-pay | Admitting: Family Medicine

## 2023-11-28 ENCOUNTER — Ambulatory Visit: Admitting: Family Medicine

## 2023-11-28 ENCOUNTER — Encounter: Payer: Self-pay | Admitting: Family Medicine

## 2023-11-28 VITALS — BP 136/82 | HR 82 | Ht 62.0 in | Wt 197.2 lb

## 2023-11-28 DIAGNOSIS — E538 Deficiency of other specified B group vitamins: Secondary | ICD-10-CM

## 2023-11-28 DIAGNOSIS — G5621 Lesion of ulnar nerve, right upper limb: Secondary | ICD-10-CM

## 2023-11-28 DIAGNOSIS — G5601 Carpal tunnel syndrome, right upper limb: Secondary | ICD-10-CM

## 2023-11-28 DIAGNOSIS — G6289 Other specified polyneuropathies: Secondary | ICD-10-CM

## 2023-11-28 DIAGNOSIS — E559 Vitamin D deficiency, unspecified: Secondary | ICD-10-CM

## 2023-11-28 DIAGNOSIS — G5622 Lesion of ulnar nerve, left upper limb: Secondary | ICD-10-CM

## 2023-11-28 MED ORDER — PREDNISONE 20 MG PO TABS: ORAL_TABLET | ORAL | 0 refills | Status: DC

## 2023-11-28 MED ORDER — PREGABALIN 50 MG PO CAPS: 50.0000 mg | ORAL_CAPSULE | Freq: Two times a day (BID) | ORAL | 0 refills | Status: DC

## 2023-11-28 NOTE — Patient Instructions (Addendum)
 Thank you for coming to the office today.  Alpha Lipoic Acid / Nervive 600mg  up to 3 times a day as needed for nerve health  B vitamins  Labs today  Taper down Gabapentin Steroid taper for 1 week When done with Gabapentin - Lyrica  start 50mg  start with once a day first and then go up to twice a day as tolerated after a few days if doing well.  Please schedule a Follow-up Appointment to: Return if symptoms worsen or fail to improve.  If you have any other questions or concerns, please feel free to call the office or send a message through MyChart. You may also schedule an earlier appointment if necessary.  Additionally, you may be receiving a survey about your experience at our office within a few days to 1 week by e-mail or mail. We value your feedback.  Marsa Officer, DO [facility], [place]

## 2023-11-28 NOTE — Telephone Encounter (Signed)
 Patient seen today for follow up Home Sleep Study. We placed orders back on 09/18/23 to [facility] and faxed them order form. Patient says she has not heard back from anyone to setup CPAP.  Would you be able to contact [facility] company to find out status or delay or if we need to send to different CPAP company?  Marsa Officer, DO [facility] 11/28/2023, 4:32 PM

## 2023-11-28 NOTE — Progress Notes (Signed)
 Subjective:    Patient ID: Joyce Rose, female    DOB: 1971-01-31, 53 y.o.   MRN: [id number]  Joyce Rose is a 53 y.o. female presenting on 11/28/2023 for Numbness (Fingers and hands)   HPI  Discussed the use of AI scribe software for clinical note transcription with the patient, who gave verbal consent to proceed.  History of Present Illness   Joyce Rose is a 54 year old female who presents with worsening pain and numbness in her extremities.  Peripheral neuropathy symptoms - Worsening pain, numbness, and tingling in extremities, described as 'very painful' and 'stiff' - Symptoms initially below the knees, now progressed upwards in both legs - Pain has intensified, now overpowering numbness and tingling - History of neuropathy initially affecting hands, with progression over the last month and a half - Pain has spread from the ulnar side of the hand to involve the entire hand and up the arm, right arm more severely affected - Significant pain and functional impairment, including difficulty holding objects due to pain  Previous interventions and diagnostic evaluation - Underwent carpal and cubital tunnel surgeries for neuropathy in hands - Previous nerve testing and other diagnostic procedures were inconclusive - Tried compression for symptom management with limited benefit - Completed courses of steroids, which provided only temporary relief - Currently taking gabapentin 300 mg three times a day, but finds it too sedating to use while working and is attempting to taper off - Uses hot water and wax treatments for symptomatic relief  Nutritional and hematologic factors - History of low B12 level a couple of years ago, normalized in 2022 - Currently taking B12 and B6 supplements for nerve health - History of elevated white blood cells - Monitoring vitamin levels, including vitamin D   Fatigue and sleep disturbance - Significant fatigue present - Underwent a sleep  study, results pending - Concerned about fatigue and weight issues - Expresses fear regarding current health condition           11/28/2023    4:08 PM 09/18/2023    5:26 PM 03/13/2023    9:08 AM  Depression screen PHQ 2/9  Decreased Interest 2 0 0  Down, Depressed, Hopeless 0 0 0  PHQ - 2 Score 2 0 0  Altered sleeping  0 0  Tired, decreased energy 3 0 1  Change in appetite 0 0 0  Feeling bad or failure about yourself  0 0 0  Trouble concentrating 0 0 1  Moving slowly or fidgety/restless 0 0 0  Suicidal thoughts 0 0 0  PHQ-9 Score  0 2  Difficult doing work/chores Somewhat difficult Not difficult at all        11/28/2023    4:08 PM 03/13/2023    9:08 AM 11/07/2022   10:06 AM  GAD 7 : Generalized Anxiety Score  Nervous, Anxious, on Edge 0 0 0  Control/stop worrying 0 0 0  Worry too much - different things 0 0 0  Trouble relaxing 0 0 0  Restless 0 0 0  Easily annoyed or irritable 0 0 0  Afraid - awful might happen 0 0 0  Total GAD 7 Score 0 0 0  Anxiety Difficulty   Not difficult at all    Social History   Tobacco Use   Smoking status: Never   Smokeless tobacco: Never  Substance Use Topics   Alcohol use: Never   Drug use: Never    Review of Systems Per HPI unless  specifically indicated above     Objective:    BP 136/82 (BP Location: Left Arm, Patient Position: Sitting, Cuff Size: Normal)   Pulse 82   Ht 5' 2 (1.575 m)   Wt 197 lb 4 oz (89.5 kg)   SpO2 99%   BMI 36.08 kg/m   Wt Readings from Last 3 Encounters:  11/28/23 197 lb 4 oz (89.5 kg)  09/18/23 199 lb 4 oz (90.4 kg)  03/13/23 194 lb (88 kg)    Physical Exam Vitals and nursing note reviewed.  Constitutional:      General: She is not in acute distress.    Appearance: She is well-developed. She is obese. She is not diaphoretic.     Comments: Well-appearing, comfortable, cooperative  HENT:     Head: Normocephalic and atraumatic.  Eyes:     General:        Right eye: No discharge.         Left eye: No discharge.     Conjunctiva/sclera: Conjunctivae normal.     Pupils: Pupils are equal, round, and reactive to light.  Neck:     Thyroid: No thyromegaly.  Cardiovascular:     Rate and Rhythm: Normal rate and regular rhythm.     Pulses: Normal pulses.     Heart sounds: Normal heart sounds. No murmur heard. Pulmonary:     Effort: Pulmonary effort is normal. No respiratory distress.     Breath sounds: Normal breath sounds. No wheezing or rales.  Abdominal:     General: Bowel sounds are normal. There is no distension.     Palpations: Abdomen is soft. There is no mass.     Tenderness: There is no abdominal tenderness.  Musculoskeletal:        General: No tenderness. Normal range of motion.     Cervical back: Normal range of motion and neck supple.     Comments: Upper / Lower Extremities: - Normal muscle tone, strength bilateral upper extremities 5/5, lower extremities 5/5  Lymphadenopathy:     Cervical: No cervical adenopathy.  Skin:    General: Skin is warm and dry.     Findings: No erythema or rash.  Neurological:     Mental Status: She is alert and oriented to person, place, and time.     Comments: Distal sensation intact to light touch all extremities  Psychiatric:        Mood and Affect: Mood normal.        Behavior: Behavior normal.        Thought Content: Thought content normal.     Comments: Well groomed, good eye contact, normal speech and thoughts     Results for orders placed or performed in visit on 11/28/23  Vitamin B12   Collection Time: 11/28/23  4:31 PM  Result Value Ref Range   Vitamin B-12 >2,000 (H) 200 - 1,100 pg/mL  VITAMIN D  25 Hydroxy (Vit-D Deficiency, Fractures)   Collection Time: 11/28/23  4:31 PM  Result Value Ref Range   Vit D, 25-Hydroxy 28 (L) 30 - 100 ng/mL  CBC with Differential/Platelet   Collection Time: 11/28/23  4:31 PM  Result Value Ref Range   WBC 7.1 3.8 - 10.8 Thousand/uL   RBC 4.81 3.80 - 5.10 Million/uL   Hemoglobin  14.9 11.7 - 15.5 g/dL   HCT 56.2 64.9 - 54.9 %   MCV 90.9 80.0 - 100.0 fL   MCH 31.0 27.0 - 33.0 pg   MCHC 34.1 32.0 - 36.0 g/dL  RDW 12.0 11.0 - 15.0 %   Platelets 226 140 - 400 Thousand/uL   MPV 10.1 7.5 - 12.5 fL   Neutro Abs 5,119 1,500 - 7,800 cells/uL   Absolute Lymphocytes 1,377 850 - 3,900 cells/uL   Absolute Monocytes 383 200 - 950 cells/uL   Eosinophils Absolute 199 15 - 500 cells/uL   Basophils Absolute 21 0 - 200 cells/uL   Neutrophils Relative % 72.1 %   Total Lymphocyte 19.4 %   Monocytes Relative 5.4 %   Eosinophils Relative 2.8 %   Basophils Relative 0.3 %  Comprehensive metabolic panel with GFR   Collection Time: 11/28/23  4:31 PM  Result Value Ref Range   Glucose, Bld 194 (H) 65 - 99 mg/dL   BUN 17 7 - 25 mg/dL   Creat 9.20 9.49 - 8.96 mg/dL   eGFR 89 > OR = 60 fO/fpw/8.26f7   BUN/Creatinine Ratio SEE NOTE: 6 - 22 (calc)   Sodium 140 135 - 146 mmol/L   Potassium 3.7 3.5 - 5.3 mmol/L   Chloride 105 98 - 110 mmol/L   CO2 25 20 - 32 mmol/L   Calcium 9.1 8.6 - 10.4 mg/dL   Total Protein 6.9 6.1 - 8.1 g/dL   Albumin 4.3 3.6 - 5.1 g/dL   Globulin 2.6 1.9 - 3.7 g/dL (calc)   AG Ratio 1.7 1.0 - 2.5 (calc)   Total Bilirubin 0.4 0.2 - 1.2 mg/dL   Alkaline phosphatase (APISO) 80 37 - 153 U/L   AST 25 10 - 35 U/L   ALT 34 (H) 6 - 29 U/L      Assessment & Plan:   Problem List Items Addressed This Visit     Ulnar neuropathy of right upper extremity - Primary   Relevant Medications   predniSONE  (DELTASONE ) 20 MG tablet   pregabalin  (LYRICA ) 50 MG capsule   Other Relevant Orders   CBC with Differential/Platelet (Completed)   Comprehensive metabolic panel with GFR (Completed)   Other Visit Diagnoses       Other polyneuropathy       Relevant Medications   predniSONE  (DELTASONE ) 20 MG tablet   pregabalin  (LYRICA ) 50 MG capsule   Other Relevant Orders   CBC with Differential/Platelet (Completed)   Comprehensive metabolic panel with GFR (Completed)      Carpal tunnel syndrome, right       Relevant Medications   pregabalin  (LYRICA ) 50 MG capsule   Other Relevant Orders   CBC with Differential/Platelet (Completed)   Comprehensive metabolic panel with GFR (Completed)     Ulnar neuropathy of left upper extremity       Relevant Medications   predniSONE  (DELTASONE ) 20 MG tablet   pregabalin  (LYRICA ) 50 MG capsule     Vitamin B12 deficiency       Relevant Orders   Vitamin B12 (Completed)     Vitamin D  deficiency       Relevant Orders   VITAMIN D  25 Hydroxy (Vit-D Deficiency, Fractures) (Completed)        Peripheral neuropathy with pain and numbness in both legs and hands History of carpal tunnel and cubital tunnel R>L but has symptoms both upper extremities Progressive neuropathy with worsening pain and numbness.   Previously managed by [facility] 2024, has had EMG / NCS  - Order blood tests: B12, vitamin D , blood counts, chemistry panel. - Prescribe steroid taper for one week. - Discontinue Gabapentin. Prescribe Lyrica  50 mg once daily, increase to twice daily as tolerated. - Recommend  alpha lipoic acid or Nervive, 600 mg up to three times daily.  Carpal tunnel syndrome, status post surgery Persistent symptoms post-surgery with pain and numbness spreading, indicating possible progression or incomplete resolution.  Fatigue Chronic fatigue with possible multifactorial causes including neuropathy and sleep disturbances. Awaiting sleep study results. - Follow up on sleep study results. - Consider neurology referral for sleep analysis if related to fatigue.   Pending lab results would refer to [facility]     Orders Placed This Encounter  Procedures   Vitamin B12   VITAMIN D  25 Hydroxy (Vit-D Deficiency, Fractures)   CBC with Differential/Platelet   Comprehensive metabolic panel with GFR    Meds ordered this encounter  Medications   predniSONE  (DELTASONE ) 20 MG tablet    Sig: Take daily with food. Start with 60mg   (3 pills) x 2 days, then reduce to 40mg  (2 pills) x 2 days, then 20mg  (1 pill) x 3 days    Dispense:  13 tablet    Refill:  0   pregabalin  (LYRICA ) 50 MG capsule    Sig: Take 1 capsule (50 mg total) by mouth 2 (two) times daily.    Dispense:  60 capsule    Refill:  0    Follow up plan: Return if symptoms worsen or fail to improve.  Marsa Officer, DO [facility] 11/28/2023, 4:15 PM

## 2023-11-29 LAB — CBC WITH DIFFERENTIAL/PLATELET
Absolute Lymphocytes: 1377 {cells}/uL (ref 850–3900)
Absolute Monocytes: 383 {cells}/uL (ref 200–950)
Basophils Absolute: 21 {cells}/uL (ref 0–200)
Basophils Relative: 0.3 %
Eosinophils Absolute: 199 {cells}/uL (ref 15–500)
Eosinophils Relative: 2.8 %
HCT: 43.7 % (ref 35.0–45.0)
Hemoglobin: 14.9 g/dL (ref 11.7–15.5)
MCH: 31 pg (ref 27.0–33.0)
MCHC: 34.1 g/dL (ref 32.0–36.0)
MCV: 90.9 fL (ref 80.0–100.0)
MPV: 10.1 fL (ref 7.5–12.5)
Monocytes Relative: 5.4 %
Neutro Abs: 5119 {cells}/uL (ref 1500–7800)
Neutrophils Relative %: 72.1 %
Platelets: 226 Thousand/uL (ref 140–400)
RBC: 4.81 Million/uL (ref 3.80–5.10)
RDW: 12 % (ref 11.0–15.0)
Total Lymphocyte: 19.4 %
WBC: 7.1 Thousand/uL (ref 3.8–10.8)

## 2023-11-29 LAB — COMPREHENSIVE METABOLIC PANEL WITH GFR
AG Ratio: 1.7 (calc) (ref 1.0–2.5)
ALT: 34 U/L — ABNORMAL HIGH (ref 6–29)
AST: 25 U/L (ref 10–35)
Albumin: 4.3 g/dL (ref 3.6–5.1)
Alkaline phosphatase (APISO): 80 U/L (ref 37–153)
BUN: 17 mg/dL (ref 7–25)
CO2: 25 mmol/L (ref 20–32)
Calcium: 9.1 mg/dL (ref 8.6–10.4)
Chloride: 105 mmol/L (ref 98–110)
Creat: 0.79 mg/dL (ref 0.50–1.03)
Globulin: 2.6 g/dL (ref 1.9–3.7)
Glucose, Bld: 194 mg/dL — ABNORMAL HIGH (ref 65–99)
Potassium: 3.7 mmol/L (ref 3.5–5.3)
Sodium: 140 mmol/L (ref 135–146)
Total Bilirubin: 0.4 mg/dL (ref 0.2–1.2)
Total Protein: 6.9 g/dL (ref 6.1–8.1)
eGFR: 89 mL/min/1.73m2 (ref >=60)

## 2023-11-29 LAB — VITAMIN B12: Vitamin B-12: >2000 pg/mL — ABNORMAL HIGH (ref 200–1100)

## 2023-11-29 LAB — VITAMIN D 25 HYDROXY (VIT D DEFICIENCY, FRACTURES): Vit D, 25-Hydroxy: 28 ng/mL — ABNORMAL LOW (ref 30–100)

## 2023-12-01 NOTE — Telephone Encounter (Signed)
 Spoke with Terri at [place]. [facility] has orders they will be processed today. And they will contact patient today.

## 2023-12-02 ENCOUNTER — Ambulatory Visit: Payer: Self-pay | Admitting: Family Medicine

## 2023-12-02 ENCOUNTER — Other Ambulatory Visit: Payer: Self-pay | Admitting: Family Medicine

## 2023-12-02 DIAGNOSIS — G5601 Carpal tunnel syndrome, right upper limb: Secondary | ICD-10-CM

## 2023-12-02 DIAGNOSIS — R202 Paresthesia of skin: Secondary | ICD-10-CM

## 2023-12-02 DIAGNOSIS — G6289 Other specified polyneuropathies: Secondary | ICD-10-CM

## 2023-12-02 DIAGNOSIS — G5621 Lesion of ulnar nerve, right upper limb: Secondary | ICD-10-CM

## 2023-12-26 ENCOUNTER — Other Ambulatory Visit: Payer: Self-pay | Admitting: Family Medicine

## 2023-12-26 ENCOUNTER — Ambulatory Visit: Admitting: Family Medicine

## 2023-12-26 VITALS — BP 120/74 | HR 78 | Ht 62.0 in | Wt 201.0 lb

## 2023-12-26 DIAGNOSIS — G5621 Lesion of ulnar nerve, right upper limb: Secondary | ICD-10-CM

## 2023-12-26 DIAGNOSIS — G6289 Other specified polyneuropathies: Secondary | ICD-10-CM

## 2023-12-26 DIAGNOSIS — E538 Deficiency of other specified B group vitamins: Secondary | ICD-10-CM

## 2023-12-26 DIAGNOSIS — G5622 Lesion of ulnar nerve, left upper limb: Secondary | ICD-10-CM

## 2023-12-26 DIAGNOSIS — R7303 Prediabetes: Secondary | ICD-10-CM

## 2023-12-26 DIAGNOSIS — I10 Essential (primary) hypertension: Secondary | ICD-10-CM

## 2023-12-26 DIAGNOSIS — Z1231 Encounter for screening mammogram for malignant neoplasm of breast: Secondary | ICD-10-CM

## 2023-12-26 DIAGNOSIS — E559 Vitamin D deficiency, unspecified: Secondary | ICD-10-CM

## 2023-12-26 DIAGNOSIS — Z0000 Encounter for general adult medical examination without abnormal findings: Secondary | ICD-10-CM

## 2023-12-26 DIAGNOSIS — E7800 Pure hypercholesterolemia, unspecified: Secondary | ICD-10-CM

## 2023-12-26 LAB — POCT GLYCOSYLATED HEMOGLOBIN (HGB A1C): Hemoglobin A1C: 5.7 % — AB (ref 4.0–5.6)

## 2023-12-26 MED ORDER — PREGABALIN 50 MG PO CAPS: 50.0000 mg | ORAL_CAPSULE | Freq: Three times a day (TID) | ORAL | 5 refills | Status: AC

## 2023-12-26 NOTE — Progress Notes (Signed)
 Subjective:    Patient ID: Joyce Rose, female    DOB: 07-07-1970, 53 y.o.   MRN: [id number]  Joyce Rose is a 53 y.o. female presenting on 12/26/2023 for Medical Management of Chronic Issues and Pre-Diabetes   HPI  Discussed the use of AI scribe software for clinical note transcription with the patient, who gave verbal consent to proceed.  History of Present Illness   Joyce Rose is a 53 year old female who presents for medication management and follow-up on CPAP equipment acquisition.  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 Tolerating well, w/o complaints. Denies CP, dyspnea, HA, edema, dizziness / lightheadedness  Pre-Diabetes A1c 5.9 down to 5.7 now Improved glucose overall, due to improving activity  Vitamin b12 supplementation and nerve health - Discontinued B12 supplementation due to elevated B12 levels - Considering alpha-lipoic acid supplements for nerve health       12/26/2023    3:52 PM 11/28/2023    4:08 PM 09/18/2023    5:26 PM  Depression screen  PHQ 2/9  Decreased Interest 0 2 0  Down, Depressed, Hopeless 0 0 0  PHQ - 2 Score 0 2 0  Altered sleeping   0  Tired, decreased energy 0 3 0  Change in appetite 1 0 0  Feeling bad or failure about yourself  0 0 0  Trouble concentrating 0 0 0  Moving slowly or fidgety/restless 0 0 0  Suicidal thoughts 0 0 0  PHQ-9 Score   0  Difficult doing work/chores Not difficult at all Somewhat difficult Not difficult at all       12/26/2023    3:52 PM 11/28/2023    4:08 PM 03/13/2023    9:08 AM 11/07/2022   10:06 AM  GAD 7 : Generalized Anxiety Score  Nervous, Anxious, on Edge 0 0 0 0  Control/stop worrying 0 0 0 0  Worry too much - different things 0 0 0 0  Trouble relaxing 0 0 0 0  Restless 0 0 0 0  Easily annoyed or irritable 0 0 0 0  Afraid - awful might happen 0 0 0 0  Total GAD 7 Score 0 0 0 0  Anxiety Difficulty Not difficult at all   Not difficult at all    Social History   Tobacco Use   Smoking status: Never   Smokeless tobacco: Never  Substance Use Topics   Alcohol use: Never   Drug use: Never    Review of Systems Per HPI unless specifically indicated above     Objective:    BP 120/74 (BP Location: Left Arm, Patient Position: Sitting, Cuff Size: Normal)   Pulse 78  Ht 5' 2 (1.575 m)   Wt 201 lb (91.2 kg)   SpO2 97%   BMI 36.76 kg/m   Wt Readings from Last 3 Encounters:  12/26/23 201 lb (91.2 kg)  11/28/23 197 lb 4 oz (89.5 kg)  09/18/23 199 lb 4 oz (90.4 kg)    Physical Exam Vitals and nursing note reviewed.  Constitutional:      General: She is not in acute distress.    Appearance: Normal appearance. She is well-developed. She is not diaphoretic.     Comments: Well-appearing, comfortable, cooperative  HENT:     Head: Normocephalic and atraumatic.  Eyes:     General:        Right eye: No discharge.        Left eye: No discharge.     Conjunctiva/sclera: Conjunctivae normal.  Cardiovascular:     Rate and Rhythm: Normal rate.  Pulmonary:      Effort: Pulmonary effort is normal.  Skin:    General: Skin is warm and dry.     Findings: No erythema or rash.  Neurological:     Mental Status: She is alert and oriented to person, place, and time.  Psychiatric:        Mood and Affect: Mood normal.        Behavior: Behavior normal.        Thought Content: Thought content normal.     Comments: Well groomed, good eye contact, normal speech and thoughts     Results for orders placed or performed in visit on 12/26/23  POCT HgB A1C   Collection Time: 12/26/23  4:00 PM  Result Value Ref Range   Hemoglobin A1C 5.7 (A) 4.0 - 5.6 %   HbA1c POC (<> result, manual entry)     HbA1c, POC (prediabetic range)     HbA1c, POC (controlled diabetic range)        Assessment & Plan:   Problem List Items Addressed This Visit     Morbid obesity (HCC)   Pre-diabetes - Primary   Relevant Orders   POCT HgB A1C (Completed)   Ulnar neuropathy of right upper extremity   Relevant Medications   pregabalin  (LYRICA ) 50 MG capsule   Other Visit Diagnoses       Other polyneuropathy       Relevant Medications   pregabalin  (LYRICA ) 50 MG capsule     Ulnar neuropathy of left upper extremity       Relevant Medications   pregabalin  (LYRICA ) 50 MG capsule     Encounter for screening mammogram for malignant neoplasm of breast       Relevant Orders   MM 3D SCREENING MAMMOGRAM BILATERAL BREAST        Peripheral neuropathy / Ulnar Neuropathy Chronic neuropathy with tolerable pain on current Lyrica  regimen. No significant side effects reported, but potential weight gain is a concern. Patient requests increased dosing for better pain coverage. Failed Gabapentin - Increase Lyrica  to 50 mg three times daily. (Instead of 50 TWICE A DAY)  - Monitor for efficacy and side effects. - Consider future dose adjustments if needed. - Recommend Alpha Lipoic Acid supplement 600mg  THREE TIMES A DAY  - Awaiting Neurologist consultation in Feb  2026  Prediabetes Improved glycemic control with lifestyle changes. Hemoglobin A1c decreased from 6.2% to 5.7%. Increased activity due to better pain management. - Continue lifestyle modifications. - Plan next full panel blood work in six months.  Hypertension Blood pressure well-controlled with current medication regimen. Valsartan  160mg   daily  Morbid Obesity BMI >36 Comorbid conditions HYPERTENSION OSA PreDiabetes Encourage lifestyle modification  OSA CPAP Equipment Acquisition Difficulty obtaining CPAP equipment from [facility]. Alternative supplier suggested if issues persist. - Contact [facility] to follow up on CPAP equipment order. - Confirm that they will contact her on Monday 10/8 to setup CPAP - Consider using [facility] if [facility] does not fulfill the order.        Orders Placed This Encounter  Procedures   MM 3D SCREENING MAMMOGRAM BILATERAL BREAST    Standing Status:   Future    Expiration Date:   12/25/2024    Reason for Exam (SYMPTOM  OR DIAGNOSIS REQUIRED):   Screening bilateral 3D Mammogram Tomo    Preferred imaging location?:   [facility]   POCT HgB A1C    Meds ordered this encounter  Medications   pregabalin  (LYRICA ) 50 MG capsule    Sig: Take 1 capsule (50 mg total) by mouth 3 (three) times daily.    Dispense:  90 capsule    Refill:  5    Increase dose frequency from 2 a day to 3 a day    Follow up plan: Return in about 6 months (around 06/25/2024) for 6 month fasting lab > 1 week later Annual Physical.  Future labs ordered for 06/25/24  Marsa Officer, DO [facility] 12/26/2023, 4:07 PM

## 2023-12-26 NOTE — Patient Instructions (Addendum)
 Thank you for coming to the office today.  [facility] Address: [address] [place], [place] [address] Phone: [phone]  [facility] Address: [address], [place], [place] [address] Phone: [phone]  Alpha Lipoic Acid 600mg  per dose up to 3 times per day for nerve health - Korene is a brand name version  Dose increase Lyrica  50mg  to 3 times a day, new rx.  For Mammogram screening for breast cancer   Call the Imaging Center below anytime to schedule your own appointment now that order has been placed.  [facility] [address], [address] [place], [place] [address] Phone: [phone]   DUE for FASTING BLOOD WORK (no food or drink after midnight before the lab appointment, only water or coffee without cream/sugar on the morning of)  SCHEDULE Lab Only visit in the morning at the clinic for lab draw in 6 MONTHS   - Make sure Lab Only appointment is at about 1 week before your next appointment, so that results will be available  For Lab Results, once available within 2-3 days of blood draw, you can can log in to MyChart online to view your results and a brief explanation. Also, we can discuss results at next follow-up visit.   Please schedule a Follow-up Appointment to: Return in about 6 months (around 06/25/2024) for 6 month fasting lab > 1 week later Annual Physical.  If you have any other questions or concerns, please feel free to call the office or send a message through MyChart. You may also schedule an earlier appointment if necessary.  Additionally, you may be receiving a survey about your experience at our office within a few days to 1 week by e-mail or mail. We value your feedback.  Marsa Officer, DO [facility] [place], [place]

## 2024-04-09 ENCOUNTER — Encounter

## 2024-04-12 ENCOUNTER — Ambulatory Visit
Admission: RE | Admit: 2024-04-12 | Discharge: 2024-04-12 | Disposition: A | Source: Ambulatory Visit | Attending: Family Medicine | Admitting: Family Medicine

## 2024-04-12 DIAGNOSIS — Z1231 Encounter for screening mammogram for malignant neoplasm of breast: Secondary | ICD-10-CM | POA: Diagnosis present

## 2024-05-18 ENCOUNTER — Ambulatory Visit: Payer: Self-pay | Admitting: Neurology

## 2024-06-25 ENCOUNTER — Other Ambulatory Visit

## 2024-07-05 ENCOUNTER — Encounter: Admitting: Family Medicine
